# Patient Record
Sex: Female | Born: 1985
Health system: Southern US, Community
[De-identification: ages and names within clinical notes are randomized; demographics above are authoritative.]

## PROBLEM LIST (undated history)

## (undated) DIAGNOSIS — O280 Abnormal hematological finding on antenatal screening of mother: Secondary | ICD-10-CM

## (undated) DIAGNOSIS — O09899 Supervision of other high risk pregnancies, unspecified trimester: Secondary | ICD-10-CM

## (undated) HISTORY — PX: NO PAST SURGERIES: SHX2092

## (undated) HISTORY — DX: Supervision of other high risk pregnancies, unspecified trimester: O09.899

## (undated) HISTORY — DX: Abnormal hematological finding on antenatal screening of mother: O28.0

---

## 2013-10-20 ENCOUNTER — Ambulatory Visit (INDEPENDENT_AMBULATORY_CARE_PROVIDER_SITE_OTHER): Payer: BC Managed Care – PPO | Admitting: Emergency Medicine

## 2013-10-20 VITALS — BP 102/64 | HR 81 | Temp 98.5°F | Resp 18 | Ht 61.0 in | Wt 107.0 lb

## 2013-10-20 DIAGNOSIS — M545 Low back pain, unspecified: Secondary | ICD-10-CM

## 2013-10-20 DIAGNOSIS — R112 Nausea with vomiting, unspecified: Secondary | ICD-10-CM

## 2013-10-20 DIAGNOSIS — N912 Amenorrhea, unspecified: Secondary | ICD-10-CM

## 2013-10-20 LAB — POCT CBC
GRANULOCYTE PERCENT: 64.3 % (ref 37–80)
HCT, POC: 41.4 % (ref 37.7–47.9)
Hemoglobin: 13.3 g/dL (ref 12.2–16.2)
LYMPH, POC: 1.7 (ref 0.6–3.4)
MCH, POC: 26 pg — AB (ref 27–31.2)
MCHC: 32 g/dL (ref 31.8–35.4)
MCV: 81.3 fL (ref 80–97)
MID (CBC): 0.3 (ref 0–0.9)
MPV: 7.7 fL (ref 0–99.8)
PLATELET COUNT, POC: 278 10*3/uL (ref 142–424)
POC GRANULOCYTE: 3.7 (ref 2–6.9)
POC LYMPH %: 30.3 % (ref 10–50)
POC MID %: 5.4 % (ref 0–12)
RBC: 5.09 M/uL (ref 4.04–5.48)
RDW, POC: 14.7 %
WBC: 5.7 10*3/uL (ref 4.6–10.2)

## 2013-10-20 LAB — POCT URINALYSIS DIPSTICK
BILIRUBIN UA: NEGATIVE
Blood, UA: NEGATIVE
GLUCOSE UA: NEGATIVE
Ketones, UA: NEGATIVE
LEUKOCYTES UA: NEGATIVE
NITRITE UA: NEGATIVE
Protein, UA: NEGATIVE
Spec Grav, UA: 1.025
Urobilinogen, UA: 0.2
pH, UA: 6.5

## 2013-10-20 LAB — POCT UA - MICROSCOPIC ONLY
BACTERIA, U MICROSCOPIC: NEGATIVE
Casts, Ur, LPF, POC: NEGATIVE
Crystals, Ur, HPF, POC: NEGATIVE
RBC, URINE, MICROSCOPIC: NEGATIVE
WBC, UR, HPF, POC: NEGATIVE
Yeast, UA: NEGATIVE

## 2013-10-20 LAB — POCT URINE PREGNANCY: PREG TEST UR: NEGATIVE

## 2013-10-20 MED ORDER — RANITIDINE HCL 150 MG PO TABS: 150.0000 mg | ORAL_TABLET | Freq: Two times a day (BID) | ORAL | Status: DC

## 2013-10-20 NOTE — Patient Instructions (Signed)
Esophagitis Esophagitis is inflammation of the esophagus. It can involve swelling, soreness, and pain in the esophagus. This condition can make it difficult and painful to swallow. CAUSES  Most causes of esophagitis are not serious. Many different factors can cause esophagitis, including:  Gastroesophageal reflux disease (GERD). This is when acid from your stomach flows up into the esophagus.  Recurrent vomiting.  An allergic-type reaction.  Certain medicines, especially those that come in large pills.  Ingestion of harmful chemicals, such as household cleaning products.  Heavy alcohol use.  An infection of the esophagus.  Radiation treatment for cancer.  Certain diseases such as sarcoidosis, Crohn's disease, and scleroderma. These diseases may cause recurrent esophagitis. SYMPTOMS   Trouble swallowing.  Painful swallowing.  Chest pain.  Difficulty breathing.  Nausea.  Vomiting.  Abdominal pain. DIAGNOSIS  Your caregiver will take your history and do a physical exam. Depending upon what your caregiver finds, certain tests may also be done, including:  Barium X-ray. You will drink a solution that coats the esophagus, and X-rays will be taken.  Endoscopy. A lighted tube is put down the esophagus so your caregiver can examine the area.  Allergy tests. These can sometimes be arranged through follow-up visits. TREATMENT  Treatment will depend on the cause of your esophagitis. In some cases, steroids or other medicines may be given to help relieve your symptoms or to treat the underlying cause of your condition. Medicines that may be recommended include:  Viscous lidocaine, to soothe the esophagus.  Antacids.  Acid reducers.  Proton pump inhibitors.  Antiviral medicines for certain viral infections of the esophagus.  Antifungal medicines for certain fungal infections of the esophagus.  Antibiotic medicines, depending on the cause of the esophagitis. HOME CARE  INSTRUCTIONS   Avoid foods and drinks that seem to make your symptoms worse.  Eat small, frequent meals instead of large meals.  Avoid eating for the 3 hours prior to your bedtime.  If you have trouble taking pills, use a pill splitter to decrease the size and likelihood of the pill getting stuck or injuring the esophagus on the way down. Drinking water after taking a pill also helps.  Stop smoking if you smoke.  Maintain a healthy weight.  Wear loose-fitting clothing. Do not wear anything tight around your waist that causes pressure on your stomach.  Raise the head of your bed 6 to 8 inches with wood blocks to help you sleep. Extra pillows will not help.  Only take over-the-counter or prescription medicines as directed by your caregiver. SEEK IMMEDIATE MEDICAL CARE IF:  You have severe chest pain that radiates into your arm, neck, or jaw.  You feel sweaty, dizzy, or lightheaded.  You have shortness of breath.  You vomit blood.  You have difficulty or pain with swallowing.  You have bloody or black, tarry stools.  You have a fever.  You have a burning sensation in the chest more than 3 times a week for more than 2 weeks.  You cannot swallow, drink, or eat.  You drool because you cannot swallow your saliva. MAKE SURE YOU:  Understand these instructions.  Will watch your condition.  Will get help right away if you are not doing well or get worse. Document Released: 02/29/2004 Document Revised: 04/15/2011 Document Reviewed: 09/21/2010 [employer] Patient Information 2015 [employer]. This information is not intended to replace advice given to you by your health care provider. Make sure you discuss any questions you have with your health care provider.  

## 2013-10-20 NOTE — Progress Notes (Signed)
Subjective:    Patient ID: Mia Pierce, female    DOB: 12-08-1985, 28 y.o.   MRN: [id number]  HPI 28 year old female here for back pain, heartburn, and emesis. Pt feels she may be pregnant. Pt's husband is translating. She has felt this way last week with upset stomach burning sensation but because of the issue of possibly being pregnant she would not take any medication.   Review of Systems     Objective:   Physical Exam H. EENT exam is unremarkable. Neck supple without adenopathy. Chest clear.patient and percussion. Heart regular rate without murmurs. Abdomen soft no hepatosplenomegaly    Results for orders placed in visit on 10/20/13  POCT URINE PREGNANCY      Result Value Ref Range   Preg Test, Ur Negative    POCT URINALYSIS DIPSTICK      Result Value Ref Range   Color, UA yellow     Clarity, UA clear     Glucose, UA neg     Bilirubin, UA neg     Ketones, UA neg     Spec Grav, UA 1.025     Blood, UA neg     pH, UA 6.5     Protein, UA neg     Urobilinogen, UA 0.2     Nitrite, UA neg     Leukocytes, UA Negative    POCT UA - MICROSCOPIC ONLY      Result Value Ref Range   WBC, Ur, HPF, POC neg     RBC, urine, microscopic neg     Bacteria, U Microscopic neg     Mucus, UA small     Epithelial cells, urine per micros 2-6     Crystals, Ur, HPF, POC neg     Casts, Ur, LPF, POC neg     Yeast, UA neg     Results for orders placed in visit on 10/20/13  POCT URINE PREGNANCY      Result Value Ref Range   Preg Test, Ur Negative    POCT URINALYSIS DIPSTICK      Result Value Ref Range   Color, UA yellow     Clarity, UA clear     Glucose, UA neg     Bilirubin, UA neg     Ketones, UA neg     Spec Grav, UA 1.025     Blood, UA neg     pH, UA 6.5     Protein, UA neg     Urobilinogen, UA 0.2     Nitrite, UA neg     Leukocytes, UA Negative    POCT UA - MICROSCOPIC ONLY      Result Value Ref Range   WBC, Ur, HPF, POC neg     RBC, urine, microscopic neg     Bacteria, U  Microscopic neg     Mucus, UA small     Epithelial cells, urine per micros 2-6     Crystals, Ur, HPF, POC neg     Casts, Ur, LPF, POC neg     Yeast, UA neg    POCT CBC      Result Value Ref Range   WBC 5.7  4.6 - 10.2 K/uL   Lymph, poc 1.7  0.6 - 3.4   POC LYMPH PERCENT 30.3  10 - 50 %L   MID (cbc) 0.3  0 - 0.9   POC MID % 5.4  0 - 12 %M   POC Granulocyte 3.7  2 -  6.9   Granulocyte percent 64.3  37 - 80 %G   RBC 5.09  4.04 - 5.48 M/uL   Hemoglobin 13.3  12.2 - 16.2 g/dL   HCT, POC 40.9  81.1 - 47.9 %   MCV 81.3  80 - 97 fL   MCH, POC 26.0 (*) 27 - 31.2 pg   MCHC 32.0  31.8 - 35.4 g/dL   RDW, POC 91.4     Platelet Count, POC 278  142 - 424 K/uL   MPV 7.7  0 - 99.8 fL       Assessment & Plan:  Will treat with zantac for heartburn. Will check serum pregnancy test. Blood work done for liver function test and h. Pylori.

## 2013-10-21 LAB — COMPLETE METABOLIC PANEL WITH GFR
ALK PHOS: 35 U/L — AB (ref 39–117)
ALT: 16 U/L (ref 0–35)
AST: 16 U/L (ref 0–37)
Albumin: 4.6 g/dL (ref 3.5–5.2)
BILIRUBIN TOTAL: 0.6 mg/dL (ref 0.2–1.2)
BUN: 9 mg/dL (ref 6–23)
CO2: 23 meq/L (ref 19–32)
Calcium: 9.2 mg/dL (ref 8.4–10.5)
Chloride: 103 mEq/L (ref 96–112)
Creat: 0.62 mg/dL (ref 0.50–1.10)
Glucose, Bld: 77 mg/dL (ref 70–99)
Potassium: 4.7 mEq/L (ref 3.5–5.3)
SODIUM: 137 meq/L (ref 135–145)
TOTAL PROTEIN: 7.6 g/dL (ref 6.0–8.3)

## 2013-10-21 LAB — HCG, QUANTITATIVE, PREGNANCY

## 2013-10-21 LAB — LIPASE: LIPASE: 10 U/L (ref 0–75)

## 2013-10-22 LAB — HELICOBACTER PYLORI  ANTIBODY, IGM: Helicobacter pylori, IgM: 1.9 U/mL (ref <9.0)

## 2014-01-29 ENCOUNTER — Ambulatory Visit (INDEPENDENT_AMBULATORY_CARE_PROVIDER_SITE_OTHER): Payer: BC Managed Care – PPO | Admitting: Physician Assistant

## 2014-01-29 VITALS — BP 97/63 | HR 80 | Temp 98.3°F | Resp 16 | Ht 61.0 in | Wt 108.8 lb

## 2014-01-29 DIAGNOSIS — H5713 Ocular pain, bilateral: Secondary | ICD-10-CM

## 2014-01-29 LAB — POCT SEDIMENTATION RATE: POCT SED RATE: 11 mm/hr (ref 0–22)

## 2014-01-29 MED ORDER — POLYETHYL GLYCOL-PROPYL GLYCOL 0.4-0.3 % OP GEL: OPHTHALMIC | Status: DC

## 2014-01-29 NOTE — Progress Notes (Signed)
IDENTIFYING INFORMATION  Mia Pierce / DOB: Jul 09, 1985 / MRN: [id number]  The patient  does not have a problem list on file.  SUBJECTIVE  CC: Eye Pain   HPI: [name] is a 28 y.o. y.o. female presenting for two weeks of bilateral eye pain that she describes as itching, burning, and pain.  She reports that her eyes often itch and burn while at work.  She admits her eyes are better when she is home.  She works in a [employer] and has been doing this work for 4 months now, but did not have eye problems previous to the last two weeks.  She denies changes in vision at this time. She denies any chemical splashes at the onset of the eye pain. She denies discharge.  She denies HA, dizziness, sore throat, ear pain, or nasal congestion.  She has not tried anything for this problem.  She does not wear contacts.   She  has no past medical history on file.    She has a current medication list which includes the following prescription(s): ranitidine.  [name] has No Known Allergies. She  reports that she has never smoked. She does not have any smokeless tobacco history on file. She reports that she does not drink alcohol or use illicit drugs. She  has no sexual activity history on file.  The patient  has no past surgical history on file.  Her family history is not on file.  ROS  Per HPI  OBJECTIVE  Blood pressure 97/63, pulse 80, temperature 98.3 F (36.8 C), temperature source Oral, resp. rate 16, height 5\' 1"  (1.549 m), weight 108 lb 12.8 oz (49.351 kg), last menstrual period 01/22/2014, SpO2 99 %. The patient's body mass index is 20.57 kg/(m^2).  Physical Exam  HENT:  Right Ear: Hearing, tympanic membrane, external ear and ear canal normal.  Left Ear: Hearing, tympanic membrane, external ear and ear canal normal.  Nose: Nose normal.  Mouth/Throat: Uvula is midline, oropharynx is clear and moist and mucous membranes are normal.  Eyes: Lids are normal. Right eye exhibits no chemosis, no  discharge, no exudate and no hordeolum. No foreign body present in the right eye. Left eye exhibits no chemosis, no discharge, no exudate and no hordeolum. No foreign body present in the left eye. Right conjunctiva is not injected. Right conjunctiva has no hemorrhage. Left conjunctiva is not injected. Left conjunctiva has no hemorrhage. No scleral icterus. Right eye exhibits normal extraocular motion and no nystagmus. Left eye exhibits normal extraocular motion and no nystagmus. Right pupil is round and reactive. Left pupil is round and reactive. Pupils are equal.     Visual Acuity Screening   Right eye Left eye Both eyes  Without correction:     With correction: 20/40 20/30 20/30       No results found for this or any previous visit (from the past 24 hour(s)).  ASSESSMENT & PLAN  Mia DuralLis was seen today for eye pain.  Diagnoses and associated orders for this visit:  Eye pain, bilateral: Could not gather enough information in clinic to rule out uveitis and there is no clear cause of eye pain. Nothing in HPI suggest corneal abrasion or HSV.  Sending to Optho for further evaluation.  - POCT SEDIMENTATION RATE: Will follow if opthalmologic exam is unyielding.  - Ambulatory referral to Ophthalmology - Polyethyl Glycol-Propyl Glycol (SYSTANE) 0.4-0.3 % GEL; Use one to two drops in the eyes as needed.    The patient  was instructed to to call or comeback to clinic as needed, or should symptoms warrant.  Deliah BostonMichael Bufford Helms, MHS, PA-C [facility] 01/29/2014 1:10 PM

## 2014-01-29 NOTE — Patient Instructions (Signed)
You have been referred to an ophthalmologist.  Please go to that visit which should take place next week.  If you wear contacts please stop until you see the eye doctor.

## 2014-04-25 ENCOUNTER — Encounter (HOSPITAL_COMMUNITY): Payer: Self-pay | Admitting: Emergency Medicine

## 2014-04-25 ENCOUNTER — Emergency Department (HOSPITAL_COMMUNITY)
Admission: EM | Admit: 2014-04-25 | Discharge: 2014-04-25 | Disposition: A | Discharge status: Home / Self Care | Payer: BLUE CROSS/BLUE SHIELD | Source: Home / Self Care | Attending: Emergency Medicine | Admitting: Emergency Medicine

## 2014-04-25 DIAGNOSIS — J069 Acute upper respiratory infection, unspecified: Secondary | ICD-10-CM

## 2014-04-25 LAB — POCT RAPID STREP A: STREPTOCOCCUS, GROUP A SCREEN (DIRECT): NEGATIVE

## 2014-04-25 MED ORDER — ACETAMINOPHEN 325 MG PO TABS
ORAL_TABLET | ORAL | Status: AC
  Filled 2014-04-25: qty 2

## 2014-04-25 MED ORDER — IPRATROPIUM BROMIDE 0.06 % NA SOLN: 2.0000 | Freq: Four times a day (QID) | NASAL | Status: DC

## 2014-04-25 MED ORDER — ACETAMINOPHEN 325 MG PO TABS
650.0000 mg | ORAL_TABLET | Freq: Once | ORAL | Status: AC
  Administered 2014-04-25: 650 mg via ORAL

## 2014-04-25 NOTE — ED Notes (Signed)
C/o ST onset 1 week Sx include: fevers, HA, BA, abd pain Pain increases when swallowing Alert, no signs of acute distress.

## 2014-04-25 NOTE — Discharge Instructions (Signed)
Rapid strep negative. Specimen will be held for 3 day culture and if results indicate need for additional treatment you will be notified by phone. Atrovent nasal spray for congestion and post nasal drainage, tylenol or ibuprofen as directed on packaging for pain Expect improvement over next few days. Please establish care with local primary care doctor.  Upper Respiratory Infection, Adult An upper respiratory infection (URI) is also sometimes known as the common cold. The upper respiratory tract includes the nose, sinuses, throat, trachea, and bronchi. Bronchi are the airways leading to the lungs. Most people improve within 1 week, but symptoms can last up to 2 weeks. A residual cough may last even longer.  CAUSES Many different viruses can infect the tissues lining the upper respiratory tract. The tissues become irritated and inflamed and often become very moist. Mucus production is also common. A cold is contagious. You can easily spread the virus to others by oral contact. This includes kissing, sharing a glass, coughing, or sneezing. Touching your mouth or nose and then touching a surface, which is then touched by another person, can also spread the virus. SYMPTOMS  Symptoms typically develop 1 to 3 days after you come in contact with a cold virus. Symptoms vary from person to person. They may include:  Runny nose.  Sneezing.  Nasal congestion.  Sinus irritation.  Sore throat.  Loss of voice (laryngitis).  Cough.  Fatigue.  Muscle aches.  Loss of appetite.  Headache.  Low-grade fever. DIAGNOSIS  You might diagnose your own cold based on familiar symptoms, since most people get a cold 2 to 3 times a year. Your caregiver can confirm this based on your exam. Most importantly, your caregiver can check that your symptoms are not due to another disease such as strep throat, sinusitis, pneumonia, asthma, or epiglottitis. Blood tests, throat tests, and X-rays are not necessary to  diagnose a common cold, but they may sometimes be helpful in excluding other more serious diseases. Your caregiver will decide if any further tests are required. RISKS AND COMPLICATIONS  You may be at risk for a more severe case of the common cold if you smoke cigarettes, have chronic heart disease (such as heart failure) or lung disease (such as asthma), or if you have a weakened immune system. The very young and very old are also at risk for more serious infections. Bacterial sinusitis, middle ear infections, and bacterial pneumonia can complicate the common cold. The common cold can worsen asthma and chronic obstructive pulmonary disease (COPD). Sometimes, these complications can require emergency medical care and may be life-threatening. PREVENTION  The best way to protect against getting a cold is to practice good hygiene. Avoid oral or hand contact with people with cold symptoms. Wash your hands often if contact occurs. There is no clear evidence that vitamin C, vitamin E, echinacea, or exercise reduces the chance of developing a cold. However, it is always recommended to get plenty of rest and practice good nutrition. TREATMENT  Treatment is directed at relieving symptoms. There is no cure. Antibiotics are not effective, because the infection is caused by a virus, not by bacteria. Treatment may include:  Increased fluid intake. Sports drinks offer valuable electrolytes, sugars, and fluids.  Breathing heated mist or steam (vaporizer or shower).  Eating chicken soup or other clear broths, and maintaining good nutrition.  Getting plenty of rest.  Using gargles or lozenges for comfort.  Controlling fevers with ibuprofen or acetaminophen as directed by your caregiver.  Increasing usage  of your inhaler if you have asthma. Zinc gel and zinc lozenges, taken in the first 24 hours of the common cold, can shorten the duration and lessen the severity of symptoms. Pain medicines may help with fever,  muscle aches, and throat pain. A variety of non-prescription medicines are available to treat congestion and runny nose. Your caregiver can make recommendations and may suggest nasal or lung inhalers for other symptoms.  HOME CARE INSTRUCTIONS   Only take over-the-counter or prescription medicines for pain, discomfort, or fever as directed by your caregiver.  Use a warm mist humidifier or inhale steam from a shower to increase air moisture. This may keep secretions moist and make it easier to breathe.  Drink enough water and fluids to keep your urine clear or pale yellow.  Rest as needed.  Return to work when your temperature has returned to normal or as your caregiver advises. You may need to stay home longer to avoid infecting others. You can also use a face mask and careful hand washing to prevent spread of the virus. SEEK MEDICAL CARE IF:   After the first few days, you feel you are getting worse rather than better.  You need your caregiver's advice about medicines to control symptoms.  You develop chills, worsening shortness of breath, or brown or red sputum. These may be signs of pneumonia.  You develop yellow or brown nasal discharge or pain in the face, especially when you bend forward. These may be signs of sinusitis.  You develop a fever, swollen neck glands, pain with swallowing, or white areas in the back of your throat. These may be signs of strep throat. SEEK IMMEDIATE MEDICAL CARE IF:   You have a fever.  You develop severe or persistent headache, ear pain, sinus pain, or chest pain.  You develop wheezing, a prolonged cough, cough up blood, or have a change in your usual mucus (if you have chronic lung disease).  You develop sore muscles or a stiff neck. Document Released: 07/17/2000 Document Revised: 04/15/2011 Document Reviewed: 04/28/2013 [facility] Patient Information 2015 [place], [place]. This information is not intended to replace advice given to you by your health  care provider. Make sure you discuss any questions you have with your health care provider.

## 2014-04-25 NOTE — ED Provider Notes (Signed)
CSN: [id number]     Arrival date & time 04/25/14  [id number] History   First MD Initiated Contact with Patient 04/25/14 [phone]     Chief Complaint  Patient presents with  . Sore Throat   (Consider location/radiation/quality/duration/timing/severity/associated sxs/prior Treatment) HPI Comments: Reports herself to be otherwise healthy Nonsmoker Works as Advertising account plannernail technician PCP: none  Patient is a 29 y.o. female presenting with URI. The history is provided by the patient and the spouse. The history is limited by a language barrier. A language interpreter was used.  URI Presenting symptoms: congestion, cough, fever, rhinorrhea and sore throat   Presenting symptoms comment:  +post nasal drainage Severity:  Mild Onset quality:  Gradual Duration:  7 days Timing:  Constant Progression:  Unchanged Chronicity:  New Ineffective treatments:  None tried Associated symptoms: myalgias   Associated symptoms: no wheezing     History reviewed. No pertinent past medical history. History reviewed. No pertinent past surgical history. No family history on file. History  Substance Use Topics  . Smoking status: Never Smoker   . Smokeless tobacco: Not on file  . Alcohol Use: No   OB History    No data available     Review of Systems  Constitutional: Positive for fever.  HENT: Positive for congestion, rhinorrhea and sore throat. Negative for mouth sores and trouble swallowing.   Eyes: Negative.   Respiratory: Positive for cough. Negative for shortness of breath and wheezing.   Cardiovascular: Negative.   Gastrointestinal: Negative.   Musculoskeletal: Positive for myalgias.    Allergies  Review of patient's allergies indicates no known allergies.  Home Medications   Prior to Admission medications   Medication Sig Start Date End Date Taking? Authorizing Provider  ipratropium (ATROVENT) 0.06 % nasal spray Place 2 sprays into both nostrils 4 (four) times daily. For nasal congestion 04/25/14   Ria ClockJennifer  Lee H [place] Latulippe, PA  Polyethyl Glycol-Propyl Glycol (SYSTANE) 0.4-0.3 % GEL Use one to two drops in the eyes as needed. 01/29/14   Ofilia NeasMichael L Clark, PA-C  ranitidine (ZANTAC) 150 MG tablet Take 1 tablet (150 mg total) by mouth 2 (two) times daily. Patient not taking: Reported on 01/29/2014 10/20/13   Collene GobbleSteven A Daub, MD   BP 119/91 mmHg  Pulse 83  Temp(Src) 98.3 F (36.8 C) (Oral)  Resp 18  SpO2 99% Physical Exam  Constitutional: She is oriented to person, place, and time. She appears well-developed and well-nourished. No distress.  HENT:  Head: Normocephalic and atraumatic.  Right Ear: Hearing, tympanic membrane, external ear and ear canal normal.  Left Ear: Hearing, tympanic membrane, external ear and ear canal normal.  Nose: Nose normal.  Mouth/Throat: Mucous membranes are normal. No oral lesions. No trismus in the jaw. No uvula swelling. Posterior oropharyngeal erythema present.  +cobblestoning of posterior oropharynx  Neck: Normal range of motion. Neck supple.  Cardiovascular: Normal rate, regular rhythm and normal heart sounds.   Pulmonary/Chest: Effort normal and breath sounds normal.  Musculoskeletal: Normal range of motion.  Lymphadenopathy:    She has no cervical adenopathy.  Neurological: She is alert and oriented to person, place, and time.  Skin: Skin is warm and dry.  Psychiatric: She has a normal mood and affect. Her behavior is normal.  Nursing note and vitals reviewed.   ED Course  Procedures (including critical care time) Labs Review Labs Reviewed - No data to display  Imaging Review No results found.   MDM   1. URI (upper respiratory infection)  Rapid strep negative. Specimen will be held for 3 day culture and if results indicate need for additional treatment she will be notified by phone. Atrovent nasal spray, tylenol, fluids Follow up prn  Ria Clock, PA 04/25/14 [id number]

## 2014-04-26 ENCOUNTER — Encounter (HOSPITAL_COMMUNITY): Payer: Self-pay | Admitting: Emergency Medicine

## 2014-04-26 ENCOUNTER — Emergency Department (HOSPITAL_COMMUNITY)
Admission: EM | Admit: 2014-04-26 | Discharge: 2014-04-26 | Disposition: A | Discharge status: Home / Self Care | Payer: BLUE CROSS/BLUE SHIELD | Attending: Emergency Medicine | Admitting: Emergency Medicine

## 2014-04-26 DIAGNOSIS — J029 Acute pharyngitis, unspecified: Secondary | ICD-10-CM

## 2014-04-26 DIAGNOSIS — Z79899 Other long term (current) drug therapy: Secondary | ICD-10-CM | POA: Insufficient documentation

## 2014-04-26 MED ORDER — CETIRIZINE-PSEUDOEPHEDRINE ER 5-120 MG PO TB12: 1.0000 | ORAL_TABLET | Freq: Two times a day (BID) | ORAL | Status: DC

## 2014-04-26 NOTE — Discharge Instructions (Signed)
Take zyrtec as directed. Return to the Emergency Department with worsening or concerning symptoms.

## 2014-04-26 NOTE — ED Notes (Signed)
Patient states went to urgent care yesterday and her throat is still hurting today.  Patient states pain x 1 week.  Patient states urgent care only gave her nasal spray, but no other prescription.

## 2014-04-26 NOTE — ED Provider Notes (Signed)
CSN: [id number]     Arrival date & time 04/26/14  0919 History  This chart was scribed for non-physician practitioner Emilia BeckKaitlyn Felise Georgia, working with Azalia BilisKevin Campos, MD by Carl Bestelina Holson, ED Scribe. This patient was seen in room TR05C/TR05C and the patient's care was started at 10:40 AM.   Chief Complaint  Patient presents with  . Sore Throat    Patient is a 29 y.o. female presenting with pharyngitis. The history is provided by the spouse. No language interpreter was used.  Sore Throat   HPI Comments: Mia RidgelLis Pierce is a 29 y.o. female who presents to the Emergency Department complaining of constant sore throat that started a week ago.  Her symptoms have worsened over the past two days.  She has taken Tylenol, Theraflu, and throat spray with no relief to her symptoms.  She lists subjective fever and nasal congestion as associated symptoms.  History reviewed. No pertinent past medical history. History reviewed. No pertinent past surgical history. No family history on file. History  Substance Use Topics  . Smoking status: Never Smoker   . Smokeless tobacco: Not on file  . Alcohol Use: No   OB History    No data available     Review of Systems  Constitutional: Positive for fever (subjective).  HENT: Positive for congestion and sore throat.   All other systems reviewed and are negative.   Allergies  Review of patient's allergies indicates no known allergies.  Home Medications   Prior to Admission medications   Medication Sig Start Date End Date Taking? Authorizing Provider  ipratropium (ATROVENT) 0.06 % nasal spray Place 2 sprays into both nostrils 4 (four) times daily. For nasal congestion 04/25/14   Ria ClockJennifer Lee H Presson, PA  Polyethyl Glycol-Propyl Glycol (SYSTANE) 0.4-0.3 % GEL Use one to two drops in the eyes as needed. 01/29/14   Ofilia NeasMichael L Clark, PA-C  ranitidine (ZANTAC) 150 MG tablet Take 1 tablet (150 mg total) by mouth 2 (two) times daily. Patient not taking: Reported on  01/29/2014 10/20/13   Collene GobbleSteven A Daub, MD   BP 108/66 mmHg  Pulse 98  Temp(Src) 98.1 F (36.7 C) (Oral)  Resp 22  Wt 110 lb (49.896 kg)  SpO2 100% Physical Exam  Constitutional: She is oriented to person, place, and time. She appears well-developed and well-nourished. No distress.  HENT:  Head: Normocephalic and atraumatic.  Mouth/Throat: Oropharynx is clear and moist. No oropharyngeal exudate.  Cobblestoning of posterior pharynx  Eyes: Conjunctivae and EOM are normal.  Neck: Normal range of motion.  Cardiovascular: Normal rate and regular rhythm.  Exam reveals no gallop and no friction rub.   No murmur heard. Pulmonary/Chest: Effort normal and breath sounds normal. She has no wheezes. She has no rales. She exhibits no tenderness.  Abdominal: Soft. She exhibits no distension. There is no tenderness. There is no rebound.  Musculoskeletal: Normal range of motion.  Neurological: She is alert and oriented to person, place, and time.  Speech is goal-oriented. Moves limbs without ataxia.   Skin: Skin is warm and dry.  Psychiatric: She has a normal mood and affect. Her behavior is normal.  Nursing note and vitals reviewed.   ED Course  Procedures (including critical care time)  DIAGNOSTIC STUDIES: Oxygen Saturation is 100% on room air, normal by my interpretation.    COORDINATION OF CARE: 10:42 AM- Will discharge the patient with a decongestant and the patient agreed to the treatment plan.  Labs Review Labs Reviewed - No data to display  Imaging Review No results found.   EKG Interpretation None      MDM   Final diagnoses:  Sore throat    Patients symptoms likely due to viral illness and nasal congestion. Vitals stable and patient afebrile. Patient's rapid strep culture pending from yesterday's UC visit.   I personally performed the services described in this documentation, which was scribed in my presence. The recorded information has been reviewed and is  accurate.   Emilia Beck, PA-C 04/27/14 2103  Azalia Bilis, MD 04/28/14 [phone]

## 2014-04-26 NOTE — ED Notes (Signed)
Pt  Husband  Called   Stating   wifes  Throat  No  Better      chartt  reviewd  After  Getting pts  Permission  Dr  Mervyn Skeeterskindl  Consulted    Advised  Wait  For  Final  Test  Result   otc  meds   For  Symptoms  And  followup if  worse

## 2014-04-28 LAB — CULTURE, GROUP A STREP: STREP A CULTURE: NEGATIVE

## 2014-11-26 ENCOUNTER — Ambulatory Visit (INDEPENDENT_AMBULATORY_CARE_PROVIDER_SITE_OTHER): Payer: BLUE CROSS/BLUE SHIELD | Admitting: Internal Medicine

## 2014-11-26 VITALS — BP 110/60 | HR 79 | Temp 98.5°F | Resp 16 | Ht 61.0 in | Wt 111.6 lb

## 2014-11-26 DIAGNOSIS — Z3200 Encounter for pregnancy test, result unknown: Secondary | ICD-10-CM | POA: Diagnosis not present

## 2014-11-26 DIAGNOSIS — M5489 Other dorsalgia: Secondary | ICD-10-CM

## 2014-11-26 LAB — POCT URINE PREGNANCY: Preg Test, Ur: POSITIVE — AB

## 2014-11-26 NOTE — Progress Notes (Signed)
   Subjective:    Patient ID: Mia Pierce, female    DOB: August 26, 1985, 29 y.o.   MRN: [id number] This chart was scribed for Ellamae Siaobert Belvia Gotschall, MD by Jolene Provostobert Halas, Medical Scribe. This patient was seen in Room 10 and the patient's care was started a 8:16 AM.  Chief Complaint  Patient presents with  . Possible Pregnancy    Needs pregnancy test  . Immunizations    Flu Vaccine  . Back Pain    x 3 days    HPI HPI Comments: Mia RidgelLis Heffler is a 29 y.o. female who [facility] pregnancy test. She states she used a [facility] pregnancy test which showed positive, and she is here to have that test validated. LMP 5 weeks ago. Here w/ husband. One other child.  She is also complaining of pain in the right posterior thoracic area over the past 3-5 days without a clear history of injury. This hurts when she reaches her twin's. There is no problem with inspiration. There are no radicular symptoms. She has no skin rashes. There is no cough or cold.   Review of Systems  Constitutional: Positive for fatigue. Negative for fever and chills.  Gastrointestinal: Negative for nausea and abdominal pain.  Genitourinary: Negative for dysuria, vaginal bleeding, vaginal pain, menstrual problem and pelvic pain.  Musculoskeletal: Negative for myalgias and back pain.       Objective:   Physical Exam  Constitutional: She is oriented to person, place, and time. She appears well-developed and well-nourished. No distress.  HENT:  Head: Normocephalic and atraumatic.  Eyes: Pupils are equal, round, and reactive to light.  Neck: Neck supple.  Cardiovascular: Normal rate.   Pulmonary/Chest: Effort normal. No respiratory distress.  Musculoskeletal: Normal range of motion.  Tender to palpation in the posterior axillary line over the mid thoracic area on the right with no defect or ecchymoses. Good range of motion of the shoulder and arm. Full excursion of the chest. No rales.  Neurological: She is alert and oriented  to person, place, and time. Coordination normal.  Skin: Skin is warm and dry. She is not diaphoretic.  Psychiatric: She has a normal mood and affect. Her behavior is normal.  Nursing note and vitals reviewed. BP 110/60 mmHg  Pulse 79  Temp(Src) 98.5 F (36.9 C) (Oral)  Resp 16  Ht 5\' 1"  (1.549 m)  Wt 111 lb 9.6 oz (50.621 kg)  BMI 21.10 kg/m2  SpO2 96%  LMP 10/25/2014 Results for orders placed or performed in visit on 11/26/14  POCT urine pregnancy  Result Value Ref Range   Preg Test, Ur Positive (A) Negative     Filed Vitals:   11/26/14 0810  BP: 110/60  Pulse: 79  Temp: 98.5 F (36.9 C)  TempSrc: Oral  Resp: 16  Height: 5\' 1"  (1.549 m)  Weight: 111 lb 9.6 oz (50.621 kg)  SpO2: 96%       Assessment & Plan:  Possible pregnancy, not yet confirmed - Plan:f/u w/ her OB  Other back pain - Plan: POCT urine pregnancy, Urine culture = mild thoracic strain///stretch ans heat  I have completed the patient encounter in its entirety as documented by the scribe, with editing by me where necessary. Treyton Slimp P. Merla Richesoolittle, M.D.  By signing my name below, I, Javier Dockerobert Ryan Halas, attest that this documentation has been prepared under the direction and in the presence of Ellamae Siaobert Korynne Dols, MD. Electronically Signed: Javier Dockerobert Ryan Halas, ER Scribe. 11/26/2014. 8:18 AM.

## 2014-11-27 LAB — URINE CULTURE: Colony Count: 35000

## 2014-12-06 ENCOUNTER — Ambulatory Visit: Payer: BLUE CROSS/BLUE SHIELD | Admitting: Family Medicine

## 2014-12-06 ENCOUNTER — Other Ambulatory Visit: Payer: Self-pay | Admitting: Family Medicine

## 2014-12-06 DIAGNOSIS — Z3201 Encounter for pregnancy test, result positive: Secondary | ICD-10-CM

## 2014-12-06 NOTE — Progress Notes (Signed)
Patient was seen 11/26/14 and had a positive urine pregnancy test at that time. She is requesting referral to obstetrics.

## 2015-01-02 LAB — OB RESULTS CONSOLE ABO/RH: RH TYPE: POSITIVE

## 2015-01-02 LAB — OB RESULTS CONSOLE GC/CHLAMYDIA
Chlamydia: NEGATIVE
Gonorrhea: NEGATIVE

## 2015-01-02 LAB — OB RESULTS CONSOLE ANTIBODY SCREEN: Antibody Screen: NEGATIVE

## 2015-01-02 LAB — OB RESULTS CONSOLE RUBELLA ANTIBODY, IGM: RUBELLA: IMMUNE

## 2015-01-02 LAB — OB RESULTS CONSOLE HIV ANTIBODY (ROUTINE TESTING): HIV: NONREACTIVE

## 2015-01-02 LAB — OB RESULTS CONSOLE HEPATITIS B SURFACE ANTIGEN: Hepatitis B Surface Ag: NEGATIVE

## 2015-01-02 LAB — OB RESULTS CONSOLE RPR: RPR: NONREACTIVE

## 2015-01-31 ENCOUNTER — Other Ambulatory Visit (HOSPITAL_COMMUNITY): Payer: Self-pay | Admitting: Obstetrics and Gynecology

## 2015-02-05 NOTE — L&D Delivery Note (Signed)
Delivery Note Pt progressed rapidly.  At 7:44 AM a viable and healthy female was delivered via Vaginal, Spontaneous Delivery (Presentation: ; Occiput Anterior).  APGAR: 7, 9; weight  P.   Placenta status: Intact, Spontaneous.  Cord: 3 vessels with the following complications: Nuchal x 2.  NICU present for delivery.    Anesthesia: None  Episiotomy: None Lacerations: 2nd degree;Perineal Suture Repair: 3.0 vicryl rapide Est. Blood Loss (mL): 200  Mom to postpartum.  Baby to Couplet care / Skin to Skin.  Bovard-Stuckert, Montay Vanvoorhis 07/13/2015, 8:12 AM  A+/Bo/Tdap in PNC/RI/ Contra? Declines circ

## 2015-02-08 ENCOUNTER — Other Ambulatory Visit (HOSPITAL_COMMUNITY): Payer: Self-pay | Admitting: Obstetrics and Gynecology

## 2015-02-08 DIAGNOSIS — O289 Unspecified abnormal findings on antenatal screening of mother: Secondary | ICD-10-CM

## 2015-02-14 ENCOUNTER — Encounter (HOSPITAL_COMMUNITY): Payer: Self-pay

## 2015-02-14 ENCOUNTER — Ambulatory Visit (HOSPITAL_COMMUNITY)
Admission: RE | Admit: 2015-02-14 | Discharge: 2015-02-14 | Disposition: A | Discharge status: Home / Self Care | Payer: BLUE CROSS/BLUE SHIELD | Source: Ambulatory Visit | Attending: Obstetrics and Gynecology | Admitting: Obstetrics and Gynecology

## 2015-02-14 ENCOUNTER — Other Ambulatory Visit (HOSPITAL_COMMUNITY): Payer: Self-pay | Admitting: Obstetrics and Gynecology

## 2015-02-14 DIAGNOSIS — O280 Abnormal hematological finding on antenatal screening of mother: Secondary | ICD-10-CM

## 2015-02-14 DIAGNOSIS — O281 Abnormal biochemical finding on antenatal screening of mother: Secondary | ICD-10-CM | POA: Diagnosis not present

## 2015-02-14 DIAGNOSIS — O289 Unspecified abnormal findings on antenatal screening of mother: Secondary | ICD-10-CM

## 2015-02-14 DIAGNOSIS — Z315 Encounter for genetic counseling: Secondary | ICD-10-CM | POA: Diagnosis not present

## 2015-02-14 DIAGNOSIS — Z3689 Encounter for other specified antenatal screening: Secondary | ICD-10-CM

## 2015-02-14 DIAGNOSIS — O09899 Supervision of other high risk pregnancies, unspecified trimester: Secondary | ICD-10-CM

## 2015-02-14 DIAGNOSIS — Z3A16 16 weeks gestation of pregnancy: Secondary | ICD-10-CM | POA: Diagnosis not present

## 2015-02-14 HISTORY — DX: Abnormal hematological finding on antenatal screening of mother: O28.0

## 2015-02-14 HISTORY — DX: Supervision of other high risk pregnancies, unspecified trimester: O09.899

## 2015-02-14 NOTE — Progress Notes (Signed)
Genetic Counseling  High-Risk Gestation Note  Appointment Date:  02/14/2015 Referred By: Edwinna Areola, * Date of Birth:  18-Jul-1985 Partner:  Warden Fillers   Pregnancy History: [id number] Estimated Date of Delivery: 08/01/15 Estimated Gestational Age: [redacted]w[redacted]d Attending: Particia Nearing, MD   [name] and her husband, Mr. Jalayla Chrismer, were seen for genetic counseling because of an increased risk for fetal Down syndrome based on first trimester screening through [facility]. Telephonic Vietnamese/English interpreter via [employer] provided interpretation for today's visit.   In Summary:  1 in 6 Down syndrome risk from First trimester screen  Detailed ultrasound performed today and within normal limits; Complete report sent under separate cover  [name] elected to proceed with NIPS (Panorama) today; declined amniocentesis  Patient requested her husband, [name], be contacted with NIPS results  Low PAPP-A on first screen (0.28 MoM); discussed increased risk for adverse pregnancy outcomes and recommendation for third trimester growth ultrasound  They were counseled regarding the First trimester screen result and the associated 1 in 6 risk for fetal Down syndrome.  We reviewed chromosomes, nondisjunction, and the common features and variable prognosis of Down syndrome.  In addition, we reviewed the screen negative risk for trisomy 91.  We also discussed other explanations for a screen positive result including: differences in maternal metabolism, and normal variation. They understand that this screening is not diagnostic for Down syndrome but provides a risk assessment.  We specifically discussed that the level of one of the proteins analyzed on the screen, PAPP-A, was very low (0.28 MoM).  This has been associated with an increased risk for growth restriction or poor pregnancy outcome later in pregnancy; therefore, we would recommend a follow up ultrasound for fetal growth in the third  trimester.  We reviewed available screening options including noninvasive prenatal screening (NIPS)/cell free fetal DNA (cffDNA) testing, and detailed ultrasound.  They were counseled that screening tests are used to modify a patient's a priori risk for aneuploidy, typically based on age. This estimate provides a pregnancy specific risk assessment. We reviewed the benefits and limitations of each option. Specifically, we discussed the conditions for which each test screens, the detection rates, and false positive rates of each. They were also counseled regarding diagnostic testing via amniocentesis. We reviewed the approximate 1 in 300-500 risk for complications for amniocentesis, including spontaneous pregnancy loss.   Detailed ultrasound was performed today and was within normal limits. Complete ultrasound results reported separately.   After consideration of all the options, they elected to proceed with NIPS (Panorama).  Those results will be available in 8-10 days. The patient requested we call her husband, [name], who speaks [place], with these results once available. Diagnostic testing was declined.  They understand that screening tests cannot rule out all birth defects or genetic syndromes. The patient was advised of this limitation and states she still does not want additional testing at this time.   Both family histories were reviewed and found to be contributory for possible medical condition for Mr. Eckley female paternal first cousin. He was unable to describe this relative's condition but stated that sometimes he is "different" and that this comes from the relative's mother's health conditions (who is not related to Mr. Partin). We discussed that without more information it is difficult to assess what this particular condition is, the etiology, and the recurrence risk for relatives. Given the degree of relation alone, recurrence risk is likely low. However, without further information regarding the  provided family history, an  accurate genetic risk cannot be calculated. Further genetic counseling is warranted if more information is obtained.  [name] denied exposure to environmental toxins or chemical agents. She denied the use of alcohol, tobacco or street drugs. She denied significant viral illnesses during the course of her pregnancy. Her medical and surgical histories were noncontributory.   I counseled this couple for approximately 45 minutes regarding the above risks and available options.   Quinn PlowmanKaren Tison Leibold, MS,  Certified Genetic Counselor 02/14/2015

## 2015-02-23 ENCOUNTER — Telehealth (HOSPITAL_COMMUNITY): Payer: Self-pay | Admitting: MS"

## 2015-02-23 NOTE — Telephone Encounter (Signed)
Called [name]'s husband, Mia Pierce, per patient's previous request to discuss her prenatal cell free DNA test results.  [name] had Panorama testing through [facility].  Testing was offered because of Down syndrome risk from first screen.   The patient was identified by name and DOB.  We reviewed that these are within normal limits, showing a less than 1 in 10,000 risk for trisomies 21, 18 and 13, and monosomy X (Turner syndrome).  In addition, the risk for triploidy/vanishing twin and sex chromosome trisomies (47,XXX and 47,XXY) was also low risk.  We reviewed that this testing identifies > 99% of pregnancies with trisomy 31, trisomy 25, sex chromosome trisomies (47,XXX and 47,XXY), and triploidy. The detection rate for trisomy 18 is 96%.  The detection rate for monosomy X is ~92%.  The false positive rate is <0.1% for all conditions. Testing was also consistent with female fetal sex. They understand that this testing does not identify all genetic conditions.  All questions were answered to his satisfaction, he was encouraged to call with additional questions or concerns.  Quinn Plowman, MS Certified Genetic Counselor 02/23/2015 2:27 PM

## 2015-02-24 ENCOUNTER — Encounter (HOSPITAL_COMMUNITY): Payer: Self-pay

## 2015-02-24 ENCOUNTER — Other Ambulatory Visit (HOSPITAL_COMMUNITY): Payer: Self-pay

## 2015-02-28 ENCOUNTER — Other Ambulatory Visit (HOSPITAL_COMMUNITY): Payer: Self-pay | Admitting: MS"

## 2015-03-02 ENCOUNTER — Encounter (HOSPITAL_COMMUNITY): Payer: BLUE CROSS/BLUE SHIELD

## 2015-03-02 ENCOUNTER — Inpatient Hospital Stay (HOSPITAL_COMMUNITY): Admission: RE | Admit: 2015-03-02 | Payer: BLUE CROSS/BLUE SHIELD | Source: Ambulatory Visit

## 2015-03-15 ENCOUNTER — Inpatient Hospital Stay (HOSPITAL_COMMUNITY)
Admission: AD | Admit: 2015-03-15 | Payer: BLUE CROSS/BLUE SHIELD | Source: Ambulatory Visit | Admitting: Obstetrics and Gynecology

## 2015-05-08 DIAGNOSIS — Z36 Encounter for antenatal screening of mother: Secondary | ICD-10-CM | POA: Diagnosis not present

## 2015-05-08 DIAGNOSIS — O289 Unspecified abnormal findings on antenatal screening of mother: Secondary | ICD-10-CM | POA: Diagnosis not present

## 2015-05-08 DIAGNOSIS — Z0372 Encounter for suspected placental problem ruled out: Secondary | ICD-10-CM | POA: Diagnosis not present

## 2015-05-08 DIAGNOSIS — Z3A27 27 weeks gestation of pregnancy: Secondary | ICD-10-CM | POA: Diagnosis not present

## 2015-05-17 DIAGNOSIS — Z3A29 29 weeks gestation of pregnancy: Secondary | ICD-10-CM | POA: Diagnosis not present

## 2015-05-17 DIAGNOSIS — Z23 Encounter for immunization: Secondary | ICD-10-CM | POA: Diagnosis not present

## 2015-05-17 DIAGNOSIS — Z3483 Encounter for supervision of other normal pregnancy, third trimester: Secondary | ICD-10-CM | POA: Diagnosis not present

## 2015-05-31 DIAGNOSIS — Z3A31 31 weeks gestation of pregnancy: Secondary | ICD-10-CM | POA: Diagnosis not present

## 2015-05-31 DIAGNOSIS — Z3483 Encounter for supervision of other normal pregnancy, third trimester: Secondary | ICD-10-CM | POA: Diagnosis not present

## 2015-06-15 DIAGNOSIS — O3663X0 Maternal care for excessive fetal growth, third trimester, not applicable or unspecified: Secondary | ICD-10-CM | POA: Diagnosis not present

## 2015-06-15 DIAGNOSIS — Z3A33 33 weeks gestation of pregnancy: Secondary | ICD-10-CM | POA: Diagnosis not present

## 2015-06-29 DIAGNOSIS — O289 Unspecified abnormal findings on antenatal screening of mother: Secondary | ICD-10-CM | POA: Diagnosis not present

## 2015-06-29 DIAGNOSIS — Z36 Encounter for antenatal screening of mother: Secondary | ICD-10-CM | POA: Diagnosis not present

## 2015-06-29 DIAGNOSIS — O3663X0 Maternal care for excessive fetal growth, third trimester, not applicable or unspecified: Secondary | ICD-10-CM | POA: Diagnosis not present

## 2015-06-29 DIAGNOSIS — Z3A35 35 weeks gestation of pregnancy: Secondary | ICD-10-CM | POA: Diagnosis not present

## 2015-06-29 LAB — OB RESULTS CONSOLE GBS: STREP GROUP B AG: NEGATIVE

## 2015-07-13 ENCOUNTER — Inpatient Hospital Stay (HOSPITAL_COMMUNITY)
Admission: AD | Admit: 2015-07-13 | Discharge: 2015-07-15 | DRG: 775 | Disposition: A | Discharge status: Home / Self Care | Payer: BLUE CROSS/BLUE SHIELD | Source: Ambulatory Visit | Attending: Obstetrics and Gynecology | Admitting: Obstetrics and Gynecology

## 2015-07-13 ENCOUNTER — Encounter (HOSPITAL_COMMUNITY): Payer: Self-pay

## 2015-07-13 DIAGNOSIS — Z23 Encounter for immunization: Secondary | ICD-10-CM | POA: Diagnosis not present

## 2015-07-13 DIAGNOSIS — Z3A37 37 weeks gestation of pregnancy: Secondary | ICD-10-CM | POA: Diagnosis not present

## 2015-07-13 DIAGNOSIS — O09899 Supervision of other high risk pregnancies, unspecified trimester: Secondary | ICD-10-CM

## 2015-07-13 DIAGNOSIS — IMO0001 Reserved for inherently not codable concepts without codable children: Secondary | ICD-10-CM

## 2015-07-13 DIAGNOSIS — O280 Abnormal hematological finding on antenatal screening of mother: Secondary | ICD-10-CM

## 2015-07-13 LAB — CBC
HCT: 31.5 % — ABNORMAL LOW (ref 36.0–46.0)
HEMOGLOBIN: 10.4 g/dL — AB (ref 12.0–15.0)
MCH: 24.2 pg — AB (ref 26.0–34.0)
MCHC: 33 g/dL (ref 30.0–36.0)
MCV: 73.4 fL — AB (ref 78.0–100.0)
PLATELETS: 271 10*3/uL (ref 150–400)
RBC: 4.29 MIL/uL (ref 3.87–5.11)
RDW: 14.8 % (ref 11.5–15.5)
WBC: 7.9 10*3/uL (ref 4.0–10.5)

## 2015-07-13 LAB — TYPE AND SCREEN
ABO/RH(D): A POS
Antibody Screen: NEGATIVE

## 2015-07-13 LAB — ABO/RH: ABO/RH(D): A POS

## 2015-07-13 LAB — RPR: RPR: NONREACTIVE

## 2015-07-13 MED ORDER — WITCH HAZEL-GLYCERIN EX PADS: 1.0000 "application " | MEDICATED_PAD | CUTANEOUS | Status: DC | PRN

## 2015-07-13 MED ORDER — OXYCODONE-ACETAMINOPHEN 5-325 MG PO TABS: 2.0000 | ORAL_TABLET | ORAL | Status: DC | PRN

## 2015-07-13 MED ORDER — SIMETHICONE 80 MG PO CHEW: 80.0000 mg | CHEWABLE_TABLET | ORAL | Status: DC | PRN

## 2015-07-13 MED ORDER — PHENYLEPHRINE 40 MCG/ML (10ML) SYRINGE FOR IV PUSH (FOR BLOOD PRESSURE SUPPORT)
80.0000 ug | PREFILLED_SYRINGE | INTRAVENOUS | Status: DC | PRN
  Filled 2015-07-13: qty 5
  Filled 2015-07-13: qty 10

## 2015-07-13 MED ORDER — LACTATED RINGERS IV SOLN
INTRAVENOUS | Status: DC
Start: 2015-07-13 — End: 2015-07-13
  Administered 2015-07-13: 05:00:00 via INTRAVENOUS

## 2015-07-13 MED ORDER — FENTANYL 2.5 MCG/ML BUPIVACAINE 1/10 % EPIDURAL INFUSION (WH - ANES)
14.0000 mL/h | INTRAMUSCULAR | Status: DC | PRN
  Filled 2015-07-13: qty 125

## 2015-07-13 MED ORDER — SENNOSIDES-DOCUSATE SODIUM 8.6-50 MG PO TABS
2.0000 | ORAL_TABLET | ORAL | Status: DC
  Administered 2015-07-13 – 2015-07-14 (×2): 2 via ORAL
  Filled 2015-07-13 (×2): qty 2

## 2015-07-13 MED ORDER — ONDANSETRON HCL 4 MG/2ML IJ SOLN: 4.0000 mg | Freq: Four times a day (QID) | INTRAMUSCULAR | Status: DC | PRN

## 2015-07-13 MED ORDER — DIBUCAINE 1 % RE OINT: 1.0000 "application " | TOPICAL_OINTMENT | RECTAL | Status: DC | PRN

## 2015-07-13 MED ORDER — ZOLPIDEM TARTRATE 5 MG PO TABS: 5.0000 mg | ORAL_TABLET | Freq: Every evening | ORAL | Status: DC | PRN

## 2015-07-13 MED ORDER — SOD CITRATE-CITRIC ACID 500-334 MG/5ML PO SOLN: 30.0000 mL | ORAL | Status: DC | PRN

## 2015-07-13 MED ORDER — PRENATAL MULTIVITAMIN CH
1.0000 | ORAL_TABLET | Freq: Every day | ORAL | Status: DC
  Administered 2015-07-13 – 2015-07-14 (×2): 1 via ORAL
  Filled 2015-07-13 (×2): qty 1

## 2015-07-13 MED ORDER — OXYCODONE-ACETAMINOPHEN 5-325 MG PO TABS: 1.0000 | ORAL_TABLET | ORAL | Status: DC | PRN

## 2015-07-13 MED ORDER — LACTATED RINGERS IV SOLN
500.0000 mL | Freq: Once | INTRAVENOUS | Status: AC
  Administered 2015-07-13: 500 mL via INTRAVENOUS

## 2015-07-13 MED ORDER — ACETAMINOPHEN 325 MG PO TABS: 650.0000 mg | ORAL_TABLET | ORAL | Status: DC | PRN

## 2015-07-13 MED ORDER — BENZOCAINE-MENTHOL 20-0.5 % EX AERO
1.0000 "application " | INHALATION_SPRAY | CUTANEOUS | Status: DC | PRN
  Administered 2015-07-13: 1 via TOPICAL
  Filled 2015-07-13: qty 56

## 2015-07-13 MED ORDER — OXYCODONE-ACETAMINOPHEN 5-325 MG PO TABS
1.0000 | ORAL_TABLET | ORAL | Status: DC | PRN
  Administered 2015-07-13 – 2015-07-15 (×5): 1 via ORAL
  Filled 2015-07-13 (×5): qty 1

## 2015-07-13 MED ORDER — BUTORPHANOL TARTRATE 1 MG/ML IJ SOLN
1.0000 mg | INTRAMUSCULAR | Status: DC | PRN
  Administered 2015-07-13: 1 mg via INTRAVENOUS
  Filled 2015-07-13: qty 1

## 2015-07-13 MED ORDER — COCONUT OIL OIL
1.0000 | TOPICAL_OIL | Status: DC | PRN
Start: 2015-07-13 — End: 2015-07-15

## 2015-07-13 MED ORDER — TETANUS-DIPHTH-ACELL PERTUSSIS 5-2.5-18.5 LF-MCG/0.5 IM SUSP: 0.5000 mL | Freq: Once | INTRAMUSCULAR | Status: DC

## 2015-07-13 MED ORDER — LACTATED RINGERS IV SOLN: INTRAVENOUS | Status: DC

## 2015-07-13 MED ORDER — ONDANSETRON HCL 4 MG PO TABS: 4.0000 mg | ORAL_TABLET | ORAL | Status: DC | PRN

## 2015-07-13 MED ORDER — OXYTOCIN BOLUS FROM INFUSION
500.0000 mL | INTRAVENOUS | Status: DC
  Administered 2015-07-13: 500 mL via INTRAVENOUS

## 2015-07-13 MED ORDER — EPHEDRINE 5 MG/ML INJ
10.0000 mg | INTRAVENOUS | Status: DC | PRN
  Filled 2015-07-13: qty 2

## 2015-07-13 MED ORDER — DIPHENHYDRAMINE HCL 50 MG/ML IJ SOLN: 12.5000 mg | INTRAMUSCULAR | Status: DC | PRN

## 2015-07-13 MED ORDER — IBUPROFEN 600 MG PO TABS
600.0000 mg | ORAL_TABLET | Freq: Four times a day (QID) | ORAL | Status: DC
  Administered 2015-07-13 – 2015-07-15 (×9): 600 mg via ORAL
  Filled 2015-07-13 (×9): qty 1

## 2015-07-13 MED ORDER — DIPHENHYDRAMINE HCL 25 MG PO CAPS: 25.0000 mg | ORAL_CAPSULE | Freq: Four times a day (QID) | ORAL | Status: DC | PRN

## 2015-07-13 MED ORDER — ONDANSETRON HCL 4 MG/2ML IJ SOLN: 4.0000 mg | INTRAMUSCULAR | Status: DC | PRN

## 2015-07-13 MED ORDER — LIDOCAINE HCL (PF) 1 % IJ SOLN
30.0000 mL | INTRAMUSCULAR | Status: AC | PRN
  Administered 2015-07-13: 30 mL via SUBCUTANEOUS
  Filled 2015-07-13: qty 30

## 2015-07-13 MED ORDER — LACTATED RINGERS IV SOLN
500.0000 mL | INTRAVENOUS | Status: DC | PRN
  Administered 2015-07-13: 1000 mL via INTRAVENOUS

## 2015-07-13 MED ORDER — OXYTOCIN 40 UNITS IN LACTATED RINGERS INFUSION - SIMPLE MED
2.5000 [IU]/h | INTRAVENOUS | Status: DC
  Administered 2015-07-13: 39.96 [IU]/h via INTRAVENOUS
  Filled 2015-07-13: qty 1000

## 2015-07-13 NOTE — H&P (Signed)
Mia Pierce Schools is a 30 y.o. female, G2 P1001, EGA 37+ weeks [facility] for leaking fluid and ctx.  Eval in MAU confirms ROM, painful ctx, VE 3 cm dilated per RN.  Prenatal care complicated by 1:6 risk Trisomy 21 on 1st trimester screen, low risk female by Panorama with normal u/s.   . Maternal Medical History:  Reason for admission: Rupture of membranes and contractions.   Fetal activity: Perceived fetal activity is normal.      OB History    Gravida Para Term Preterm AB TAB SAB Ectopic Multiple Living   2 1 1       1     SVD, no complications  No past medical history on file. No past surgical history on file. Family History: family history is not on file. Social History:  reports that she has never smoked. She does not have any smokeless tobacco history on file. She reports that she does not drink alcohol or use illicit drugs.   Prenatal Transfer Tool  Maternal Diabetes: No Genetic Screening: Abnormal:  Results: Elevated risk of Trisomy 21 Maternal Ultrasounds/Referrals: Normal Fetal Ultrasounds or other Referrals:  None Maternal Substance Abuse:  No Significant Maternal Medications:  None Significant Maternal Lab Results:  Lab values include: Group B Strep negative Other Comments:  1:6 risk Trisomy 21 by 1st trimester screen, low risk female by Panorama  Review of Systems  Respiratory: Negative.   Cardiovascular: Negative.     Dilation: 3 Effacement (%): 60 Station: -2 Exam by:: ansah-mensah, rnc  Blood pressure 140/84, pulse 104, temperature 98 F (36.7 C), temperature source Oral, resp. rate 15, height 5' 0.5" (1.537 m), weight 68.493 kg (151 lb), last menstrual period 10/25/2014, SpO2 100 %. Maternal Exam:  Uterine Assessment: Contraction strength is moderate.  Contraction frequency is irregular.   Abdomen: Patient reports no abdominal tenderness. Estimated fetal weight is 7 lbs.   Fetal presentation: vertex  Introitus: Normal vulva. Normal vagina.  Amniotic  fluid character: clear.  Pelvis: adequate for delivery.   Cervix: Cervix evaluated by digital exam.     Physical Exam  Constitutional: She appears well-developed and well-nourished.  GI: Soft.    Prenatal labs: ABO, Rh: A/Positive/-- (11/28 0000) Antibody: Negative (11/28 0000) Rubella: Immune (11/28 0000) RPR: Nonreactive (11/28 0000)  HBsAg: Negative (11/28 0000)  HIV: Non-reactive (11/28 0000)  GBS: Negative (05/25 0000)   Assessment/Plan: IUP at 37+ weeks with SROM in early labor.  Will admit and monitor progress, augment if needed.     Omolara Carol D 07/13/2015, 4:26 AM

## 2015-07-13 NOTE — MAU Note (Signed)
Pt reports ROM at 0245, some contractions.

## 2015-07-14 LAB — CBC
HCT: 27 % — ABNORMAL LOW (ref 36.0–46.0)
HEMOGLOBIN: 8.9 g/dL — AB (ref 12.0–15.0)
MCH: 24.3 pg — AB (ref 26.0–34.0)
MCHC: 33 g/dL (ref 30.0–36.0)
MCV: 73.8 fL — ABNORMAL LOW (ref 78.0–100.0)
PLATELETS: 241 10*3/uL (ref 150–400)
RBC: 3.66 MIL/uL — AB (ref 3.87–5.11)
RDW: 15.1 % (ref 11.5–15.5)
WBC: 12.7 10*3/uL — AB (ref 4.0–10.5)

## 2015-07-14 NOTE — Progress Notes (Signed)
Post Partum Day 1 Subjective: no complaints and tolerating PO Pt speaks limited english  Objective: Blood pressure 123/80, pulse 85, temperature 98.2 F (36.8 C), temperature source Oral, resp. rate 18, height 5' 0.5" (1.537 m), weight 151 lb (68.493 kg), last menstrual period 10/25/2014, SpO2 99 %, unknown if currently breastfeeding.  Physical Exam:  General: alert and cooperative Lochia: appropriate Uterine Fundus: firm    Recent Labs  07/13/15 0438 07/14/15 0529  HGB 10.4* 8.9*  HCT 31.5* 27.0*    Assessment/Plan: Plan for discharge tomorrow given baby 37 weeks    LOS: 1 day   Maysin Carstens W 07/14/2015, 9:03 AM

## 2015-07-14 NOTE — Discharge Summary (Signed)
OB Discharge Summary     Patient Name: Mia Pierce DOB: Feb 15, 1985 MRN: [id number]  Date of admission: 07/13/2015 Delivering MD: Sherian Rein   Date of discharge: 07/15/2015  Admitting diagnosis: 37 WEEKS ROM Intrauterine pregnancy: [redacted]w[redacted]d     Secondary diagnosis:  Principal Problem:   SVD (spontaneous vaginal delivery) Active Problems:   Active labor at term  Additional problems: none     Discharge diagnosis: Term Pregnancy Delivered                                                                                                Post partum procedures:none  Augmentation: none  Complications: None  Hospital course:  Onset of Labor With Vaginal Delivery     30 y.o. yo G2P2001 at [redacted]w[redacted]d was admitted in Active Labor on 07/13/2015. Patient had an uncomplicated labor course as follows:  Membrane Rupture Time/Date: 2:45 AM ,07/13/2015   Intrapartum Procedures: Episiotomy: None [1]                                         Lacerations:  2nd degree [3];Perineal [11]  Patient had a delivery of a Viable infant. 07/13/2015  Information for the patient's newborn:  Kellee, Sittner [[id number]]        Pateint had an uncomplicated postpartum course.  She is ambulating, tolerating a regular diet, passing flatus, and urinating well. Patient is discharged home in stable condition on 07/15/2015.    Physical exam  Filed Vitals:   07/13/15 2300 07/14/15 0530 07/14/15 1735 07/15/15 0550  BP: 118/81 123/80 112/58 100/53  Pulse: 91 85 85 84  Temp: 98.2 F (36.8 C) 98.2 F (36.8 C) 98.4 F (36.9 C) 97.7 F (36.5 C)  TempSrc: Oral Oral Oral Oral  Resp: Height:      Weight:      SpO2:       General: alert and cooperative Lochia: appropriate Uterine Fundus: firm  Labs: Lab Results  Component Value Date   WBC 12.7* 07/14/2015   HGB 8.9* 07/14/2015   HCT 27.0* 07/14/2015   MCV 73.8* 07/14/2015   PLT 241 07/14/2015   CMP Latest Ref Rng 10/20/2013  Glucose 70 - 99 mg/dL 77   BUN 6 - 23 mg/dL 9  Creatinine 6.21 - 3.08 mg/dL 6.57  Sodium 846 - 962 mEq/L 137  Potassium 3.5 - 5.3 mEq/L 4.7  Chloride 96 - 112 mEq/L 103  CO2 19 - 32 mEq/L 23  Calcium 8.4 - 10.5 mg/dL 9.2  Total Protein 6.0 - 8.3 g/dL 7.6  Total Bilirubin 0.2 - 1.2 mg/dL 0.6  Alkaline Phos 39 - 117 U/L 35(L)  AST 0 - 37 U/L 16  ALT 0 - 35 U/L 16    Discharge instruction: per After Visit Summary and "Baby and Me Booklet".  After visit meds:    Medication List    STOP taking these medications        DICLEGIS PO  TAKE these medications        ibuprofen 600 MG tablet  Commonly known as:  ADVIL,MOTRIN  Take 1 tablet (600 mg total) by mouth every 6 (six) hours.     oxyCODONE-acetaminophen 5-325 MG tablet  Commonly known as:  PERCOCET/ROXICET  Take 1 tablet by mouth every 4 (four) hours as needed (pain scale 4-7).     PRENATAL VITAMIN PO  Take by mouth.        Diet: routine diet  Activity: Advance as tolerated. Pelvic rest for 6 weeks.   Outpatient follow up:6 weeks Follow up Appt:No future appointments. Follow up Visit:No Follow-up on file.  Postpartum contraception: Undecided  Newborn Data: Live born female  Birth Weight: 6 lb 12.1 oz (3065 g) APGAR: 7, 9  Baby Feeding: Breast Disposition:home with mother   07/15/2015 Oliver PilaICHARDSON,Roxane Puerto W, MD

## 2015-07-15 MED ORDER — IBUPROFEN 600 MG PO TABS: 600.0000 mg | ORAL_TABLET | Freq: Four times a day (QID) | ORAL | Status: DC

## 2015-07-15 MED ORDER — OXYCODONE-ACETAMINOPHEN 5-325 MG PO TABS: 1.0000 | ORAL_TABLET | ORAL | Status: DC | PRN

## 2015-07-15 NOTE — Progress Notes (Signed)
Post Partum Day 2 Subjective: no complaints and tolerating PO  Objective: Blood pressure 100/53, pulse 84, temperature 97.7 F (36.5 C), temperature source Oral, resp. rate 18, height 5' 0.5" (1.537 m), weight 151 lb (68.493 kg), last menstrual period 10/25/2014, SpO2 99 %, unknown if currently breastfeeding.  Physical Exam:  General: alert and cooperative Lochia: appropriate Uterine Fundus: firm     Recent Labs  07/13/15 0438 07/14/15 0529  HGB 10.4* 8.9*  HCT 31.5* 27.0*    Assessment/Plan: Discharge home  Husband translating and they decline circumcision   LOS: 2 days   Charnika Herbst W 07/15/2015, 10:10 AM

## 2015-08-17 DIAGNOSIS — Z3009 Encounter for other general counseling and advice on contraception: Secondary | ICD-10-CM | POA: Diagnosis not present

## 2015-08-17 DIAGNOSIS — Z1389 Encounter for screening for other disorder: Secondary | ICD-10-CM | POA: Diagnosis not present

## 2015-11-22 DIAGNOSIS — R05 Cough: Secondary | ICD-10-CM | POA: Diagnosis not present

## 2015-11-22 DIAGNOSIS — Z5321 Procedure and treatment not carried out due to patient leaving prior to being seen by health care provider: Secondary | ICD-10-CM | POA: Diagnosis not present

## 2015-11-22 DIAGNOSIS — R509 Fever, unspecified: Secondary | ICD-10-CM | POA: Diagnosis not present

## 2015-11-22 DIAGNOSIS — Z79899 Other long term (current) drug therapy: Secondary | ICD-10-CM | POA: Diagnosis not present

## 2016-02-10 ENCOUNTER — Ambulatory Visit (INDEPENDENT_AMBULATORY_CARE_PROVIDER_SITE_OTHER): Payer: BLUE CROSS/BLUE SHIELD | Admitting: Physician Assistant

## 2016-02-10 VITALS — BP 110/78 | HR 80 | Temp 98.3°F | Resp 16 | Ht 60.5 in | Wt 121.8 lb

## 2016-02-10 DIAGNOSIS — H9203 Otalgia, bilateral: Secondary | ICD-10-CM

## 2016-02-10 DIAGNOSIS — R05 Cough: Secondary | ICD-10-CM | POA: Diagnosis not present

## 2016-02-10 DIAGNOSIS — R059 Cough, unspecified: Secondary | ICD-10-CM

## 2016-02-10 MED ORDER — AZELASTINE HCL 0.15 % NA SOLN: 2.0000 | Freq: Two times a day (BID) | NASAL | 0 refills | Status: DC

## 2016-02-10 MED ORDER — GUAIFENESIN ER 1200 MG PO TB12: 1.0000 | ORAL_TABLET | Freq: Two times a day (BID) | ORAL | 1 refills | Status: DC | PRN

## 2016-02-10 MED ORDER — BENZONATATE 100 MG PO CAPS: 100.0000 mg | ORAL_CAPSULE | Freq: Three times a day (TID) | ORAL | 0 refills | Status: DC | PRN

## 2016-02-10 NOTE — Patient Instructions (Addendum)
Get plenty of rest and drink at least 64 ounces of water daily. The ear pain may be caused by eustachian tube dysfunction, and if so, the nasal spray may help. If it persists, please let us know.    IF you received an x-ray today, you will receive an invoice from [facility]. Please contact [facility] at [phone] with questions or concerns regarding your invoice.   IF you received labwork today, you will receive an invoice from [facility]. Please contact [facility] at [phone] with questions or concerns regarding your invoice.   Our billing staff will not be able to assist you with questions regarding bills from these companies.  You will be contacted with the lab results as soon as they are available. The fastest way to get your results is to activate your My Chart account. Instructions are located on the last page of this paperwork. If you have not heard from us regarding the results in 2 weeks, please contact this office.

## 2016-02-10 NOTE — Progress Notes (Signed)
Patient ID: Mia Pierce, female    DOB: May 17, 1985, 31 y.o.   MRN: [id number]  PCP: No PCP Per Patient  Chief Complaint  Patient presents with  . Cough    x 2 weeks, productive  . Flu Vaccine    Pended    Subjective:   Presents for evaluation of cough x 1 week. She is accompanied by her husband, who translates, and their 2 children.  Coughing causes pain in the throat a little bit. Productive of yellow sputum.  No nasal/sinus congestion now, but had some a few days ago. Ear pain bilateral, x a couple of years. Has seen ENT and reports she was told that nothing is wrong. Sometimes SOB. Now it's "just a little bit." OTC cough medication last night without benefit. No nausea, vomiting, diarrhea. No urinary symptoms. No back or chest pain.   Review of Systems As above    There are no active problems to display for this patient.    Prior to Admission medications   Medication Sig Start Date End Date Taking? Authorizing Provider  ibuprofen (ADVIL,MOTRIN) 600 MG tablet Take 1 tablet (600 mg total) by mouth every 6 (six) hours. Patient not taking: Reported on 02/10/2016 07/15/15   Huel Cote, MD  oxyCODONE-acetaminophen (PERCOCET/ROXICET) 5-325 MG tablet Take 1 tablet by mouth every 4 (four) hours as needed (pain scale 4-7). Patient not taking: Reported on 02/10/2016 07/15/15   Huel Cote, MD     No Known Allergies     Objective:  Physical Exam  Constitutional: She is oriented to person, place, and time. She appears well-developed and well-nourished. She is active and cooperative. No distress.  BP 110/78   Pulse 80   Temp 98.3 F (36.8 C) (Oral)   Resp 16   Ht 5' 0.5" (1.537 m)   Wt 121 lb 12.8 oz (55.2 kg)   LMP 02/03/2016   SpO2 98%   Breastfeeding? No   BMI 23.40 kg/m   HENT:  Head: Normocephalic and atraumatic.  Right Ear: Hearing, tympanic membrane, external ear and ear canal normal.  Left Ear: Hearing, tympanic membrane, external ear and ear  canal normal.  Nose: Mucosal edema present. No rhinorrhea. No epistaxis. Right sinus exhibits no maxillary sinus tenderness and no frontal sinus tenderness. Left sinus exhibits no maxillary sinus tenderness and no frontal sinus tenderness.  Mouth/Throat: Uvula is midline, oropharynx is clear and moist and mucous membranes are normal.  Eyes: Conjunctivae are normal. No scleral icterus.  Neck: Normal range of motion. Neck supple. No thyromegaly present.  Cardiovascular: Normal rate, regular rhythm and normal heart sounds.   Pulses:      Radial pulses are 2+ on the right side, and 2+ on the left side.  Pulmonary/Chest: Effort normal and breath sounds normal.  Lymphadenopathy:       Head (right side): No tonsillar, no preauricular, no posterior auricular and no occipital adenopathy present.       Head (left side): No tonsillar, no preauricular, no posterior auricular and no occipital adenopathy present.    She has no cervical adenopathy.       Right: No supraclavicular adenopathy present.       Left: No supraclavicular adenopathy present.  Neurological: She is alert and oriented to person, place, and time. No sensory deficit.  Skin: Skin is warm, dry and intact. No rash noted. No cyanosis or erythema. Nails show no clubbing.  Psychiatric: She has a normal mood and affect. Her speech is normal and  behavior is normal.           Assessment & Plan:   1. Cough Likely post-viral cough syndrome. 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 Supportive care. - Azelastine HCl 0.15 % SOLN; Place 2 sprays into both nostrils 2 (two) times daily.  Dispense: 30 mL; Refill: 0 - benzonatate (TESSALON) 100 MG capsule; Take 1-2 capsules (100-200 mg total) by mouth 3 (three) times daily as needed for cough.  Dispense: 40 capsule; Refill: 0 - Guaifenesin (MUCINEX MAXIMUM STRENGTH) 1200 MG TB12; Take 1 tablet (1,200 mg total) by mouth every 12 (twelve) hours as  needed.  Dispense: 14 tablet; Refill: 1  2. Ear pain, bilateral Possibly ETD caused by allergy/irritation to fumes in nail salon. - Azelastine HCl 0.15 % SOLN; Place 2 sprays into both nostrils 2 (two) times daily.  Dispense: 30 mL; Refill: 0   Fernande Brashelle S. Myrissa Chipley, PA-C Physician Assistant-Certified Primary Care at [facility]

## 2016-03-14 DIAGNOSIS — Z01419 Encounter for gynecological examination (general) (routine) without abnormal findings: Secondary | ICD-10-CM | POA: Diagnosis not present

## 2016-03-14 DIAGNOSIS — Z1389 Encounter for screening for other disorder: Secondary | ICD-10-CM | POA: Diagnosis not present

## 2016-03-14 DIAGNOSIS — Z113 Encounter for screening for infections with a predominantly sexual mode of transmission: Secondary | ICD-10-CM | POA: Diagnosis not present

## 2016-03-14 DIAGNOSIS — Z6822 Body mass index (BMI) 22.0-22.9, adult: Secondary | ICD-10-CM | POA: Diagnosis not present

## 2016-03-14 DIAGNOSIS — Z130 Encounter for screening for diseases of the blood and blood-forming organs and certain disorders involving the immune mechanism: Secondary | ICD-10-CM | POA: Diagnosis not present

## 2017-01-15 ENCOUNTER — Other Ambulatory Visit: Payer: Self-pay

## 2017-01-15 ENCOUNTER — Ambulatory Visit: Payer: BLUE CROSS/BLUE SHIELD | Admitting: Physician Assistant

## 2017-01-15 ENCOUNTER — Encounter: Payer: Self-pay | Admitting: Physician Assistant

## 2017-01-15 VITALS — BP 104/56 | HR 94 | Temp 98.7°F | Resp 16 | Ht 61.0 in | Wt 119.6 lb

## 2017-01-15 DIAGNOSIS — H9202 Otalgia, left ear: Secondary | ICD-10-CM | POA: Diagnosis not present

## 2017-01-15 DIAGNOSIS — H60502 Unspecified acute noninfective otitis externa, left ear: Secondary | ICD-10-CM | POA: Diagnosis not present

## 2017-01-15 MED ORDER — AMOXICILLIN 875 MG PO TABS: 875.0000 mg | ORAL_TABLET | Freq: Two times a day (BID) | ORAL | 0 refills | Status: AC

## 2017-01-15 MED ORDER — OFLOXACIN 0.3 % OT SOLN: 5.0000 [drp] | Freq: Every day | OTIC | 0 refills | Status: AC

## 2017-01-15 NOTE — Progress Notes (Signed)
   Mia Pierce  MRN: [id number] DOB: 04-22-1985  PCP: Patient, No Pcp Per  Subjective:  Pt is a 31 year old female who presents to clinic for left ear pain x 4 days. She is here today with her husband who is translating. She endorses pain and drainage. She has not taken anything to feel better.  She has h/o b/l ear pain. Has been evaluated by ENT in the past and told her ears are "normal".   Review of Systems  Constitutional: Negative for chills, diaphoresis, fatigue and fever.  HENT: Positive for congestion, ear discharge and ear pain. Negative for postnasal drip, rhinorrhea, sinus pressure, sinus pain and sore throat.     There are no active problems to display for this patient.   Current Outpatient Medications on File Prior to Visit  Medication Sig Dispense Refill  . Azelastine HCl 0.15 % SOLN Place 2 sprays into both nostrils 2 (two) times daily. (Patient not taking: Reported on 01/15/2017) 30 mL 0  . benzonatate (TESSALON) 100 MG capsule Take 1-2 capsules (100-200 mg total) by mouth 3 (three) times daily as needed for cough. (Patient not taking: Reported on 01/15/2017) 40 capsule 0  . Guaifenesin (MUCINEX MAXIMUM STRENGTH) 1200 MG TB12 Take 1 tablet (1,200 mg total) by mouth every 12 (twelve) hours as needed. (Patient not taking: Reported on 01/15/2017) 14 tablet 1   No current facility-administered medications on file prior to visit.     No Known Allergies   Objective:  BP (!) 104/56   Pulse 94   Temp 98.7 F (37.1 C) (Oral)   Resp 16   Ht 5\' 1"  (1.549 m)   Wt 119 lb 9.6 oz (54.3 kg)   LMP 01/11/2017   SpO2 98%   BMI 22.60 kg/m   Physical Exam  Constitutional: She is oriented to person, place, and time and well-developed, well-nourished, and in no distress. No distress.  HENT:  Right Ear: Tympanic membrane normal.  Left Ear: There is swelling (ear canal. TTP) and tenderness (Tragus TTP). No drainage. No mastoid tenderness. Tympanic membrane is bulging.  Nose: Mucosal  edema present. No rhinorrhea. Right sinus exhibits no maxillary sinus tenderness and no frontal sinus tenderness. Left sinus exhibits no maxillary sinus tenderness and no frontal sinus tenderness.  Mouth/Throat: Oropharynx is clear and moist and mucous membranes are normal.  Cardiovascular: Normal rate, regular rhythm and normal heart sounds.  Pulmonary/Chest: Effort normal and breath sounds normal. No respiratory distress. She has no wheezes. She has no rales.  Neurological: She is alert and oriented to person, place, and time. GCS score is 15.  Skin: Skin is warm and dry.  Psychiatric: Mood, memory, affect and judgment normal.  Vitals reviewed.   Assessment and Plan :  1. Left ear pain 2. Acute otitis externa of left ear, unspecified type - ofloxacin (FLOXIN OTIC) 0.3 % OTIC solution; Place 5 drops into the left ear daily for 7 days.  Dispense: 5 mL; Refill: 0 - amoxicillin (AMOXIL) 875 MG tablet; Take 1 tablet (875 mg total) by mouth 2 (two) times daily for 7 days.  Dispense: 14 tablet; Refill: 0 - Plan to treat for otitis externa and will cover for otitis media. RTC in 1 week for recheck.   Marco CollieWhitney Smera Guyette, PA-C  Primary Care at [facility] 01/15/2017 10:50 AM

## 2017-01-15 NOTE — Patient Instructions (Addendum)
Start taking your antibiotics. Take the entire course, even if you start to feel better. Come back and see me in 1 week.    Otitis Externa Otitis externa is an infection of the outer ear canal. The outer ear canal is the area between the outside of the ear and the eardrum. Otitis externa is sometimes called "swimmer's ear." What are the causes? This condition may be caused by:  Swimming in dirty water.  Moisture in the ear.  An injury to the inside of the ear.  An object stuck in the ear.  A cut or scrape on the outside of the ear.  What increases the risk? This condition is more likely to develop in swimmers. What are the signs or symptoms? The first symptom of this condition is often itching in the ear. Later signs and symptoms include:  Swelling of the ear.  Redness in the ear.  Ear pain. The pain may get worse when you pull on your ear.  Pus coming from the ear.  How is this diagnosed? This condition may be diagnosed by examining the ear and testing fluid from the ear for bacteria and funguses. How is this treated? This condition may be treated with:  Antibiotic ear drops. These are often given for 10-14 days.  Medicine to reduce itching and swelling.  Follow these instructions at home:  If you were prescribed antibiotic ear drops, apply them as told by your health care provider. Do not stop using the antibiotic even if your condition improves.  Take over-the-counter and prescription medicines only as told by your health care provider.  Keep all follow-up visits as told by your health care provider. This is important. How is this prevented?  Keep your ear dry. Use the corner of a towel to dry your ear after you swim or bathe.  Avoid scratching or putting things in your ear. Doing these things can damage the ear canal or remove the protective wax that lines it, which makes it easier for bacteria and funguses to grow.  Avoid swimming in lakes, polluted water,  or pools that may not have the right amount of chlorine.  Consider making ear drops and putting 3 or 4 drops in each ear after you swim. Ask your health care provider about how you can make ear drops. Contact a health care provider if:  You have a fever.  After 3 days your ear is still red, swollen, painful, or draining pus.  Your redness, swelling, or pain gets worse.  You have a severe headache.  You have redness, swelling, pain, or tenderness in the area behind your ear. This information is not intended to replace advice given to you by your health care provider. Make sure you discuss any questions you have with your health care provider. Document Released: 01/21/2005 Document Revised: 02/28/2015 Document Reviewed: 10/31/2014 Elsevier Interactive Patient Education  [employer].  Thank you for coming in today. I hope you feel we met your needs.  Feel free to call PCP if you have any questions or further requests.  Please consider signing up for MyChart if you do not already have it, as this is a great way to communicate with me.  Best,  Whitney McVey, PA-C  IF you received an x-ray today, you will receive an invoice from [facility]. Please contact [facility] at [phone] with questions or concerns regarding your invoice.   IF you received labwork today, you will receive an invoice from [facility]. Please contact [facility]  at [phone] with questions or concerns regarding your invoice.   Our billing staff will not be able to assist you with questions regarding bills from these companies.  You will be contacted with the lab results as soon as they are available. The fastest way to get your results is to activate your My Chart account. Instructions are located on the last page of this paperwork. If you have not heard from [place] regarding the results in 2 weeks, please contact this office.

## 2017-01-17 ENCOUNTER — Telehealth: Payer: Self-pay | Admitting: Physician Assistant

## 2017-01-17 ENCOUNTER — Other Ambulatory Visit: Payer: Self-pay | Admitting: Physician Assistant

## 2017-01-17 ENCOUNTER — Other Ambulatory Visit: Payer: Self-pay

## 2017-01-17 ENCOUNTER — Ambulatory Visit: Payer: BLUE CROSS/BLUE SHIELD | Admitting: Physician Assistant

## 2017-01-17 ENCOUNTER — Encounter: Payer: Self-pay | Admitting: Physician Assistant

## 2017-01-17 VITALS — BP 110/68 | HR 86 | Temp 98.4°F | Resp 16 | Ht 61.0 in | Wt 118.0 lb

## 2017-01-17 DIAGNOSIS — H60392 Other infective otitis externa, left ear: Secondary | ICD-10-CM | POA: Diagnosis not present

## 2017-01-17 LAB — POCT GLYCOSYLATED HEMOGLOBIN (HGB A1C): HEMOGLOBIN A1C: 4.9

## 2017-01-17 LAB — POCT SKIN KOH: SKIN KOH, POC: NEGATIVE

## 2017-01-17 NOTE — Telephone Encounter (Signed)
Patient's husband is calling to update the provider- he thinks that his wife is not improving. They are using the ear drops and the antibiotics and she is not getting better. Patient feels like she is still feverish- they do not have thermometer at home. This is her third day on antibiotic- she is not having any improvement- patient is stating she is having some swelling and it is getting worse.They are requesting an appointment today- appointment for recheck given.

## 2017-01-17 NOTE — Progress Notes (Unsigned)
    01/17/2017 2:10 PM   DOB: 1985/11/17 / MRN: [id number]  SUBJECTIVE:  Mia Pierce is a 31 y.o. female presenting for   She has No Known Allergies.   She  has a past medical history of Abnormal maternal serum screening test (02/14/2015), High risk pregnancy with low PAPPA (02/14/2015), and SVD (spontaneous vaginal delivery) (07/13/2015).    She  reports that  has never smoked. she has never used smokeless tobacco. She reports that she does not drink alcohol or use drugs. She  has no sexual activity history on file. The patient  has no past surgical history on file.  Her family history is not on file.  ROS  The problem list and medications were reviewed and updated by myself where necessary and exist elsewhere in the encounter.   OBJECTIVE:  LMP 01/11/2017   Physical Exam  Results for orders placed or performed in visit on 01/17/17 (from the past 72 hour(s))  POCT Skin KOH     Status: None   Collection Time: 01/17/17  1:41 PM  Result Value Ref Range   Skin KOH, POC Negative Negative  POCT glycosylated hemoglobin (Hb A1C)     Status: None   Collection Time: 01/17/17  1:56 PM  Result Value Ref Range   Hemoglobin A1C 4.9     No results found.  ASSESSMENT AND PLAN:  There are no diagnoses linked to this encounter.  The patient is advised to call or return to clinic if she does not see an improvement in symptoms, or to seek the care of the closest emergency department if she worsens with the above plan.   Deliah BostonMichael Malori Myers, MHS, PA-C Primary Care at [facility] 01/17/2017 2:10 PM

## 2017-01-17 NOTE — Progress Notes (Signed)
01/17/2017 2:18 PM   DOB: 1985-08-17 / MRN: [id number]  SUBJECTIVE:  Mia Pierce is a 31 y.o. female presenting for follow up of left ear pain.  She has been taking abx drops and amox and is not feeling any better. Husband tells me "I can't keep the drop in her ear due to the swelling."   She has No Known Allergies.   She  has a past medical history of Abnormal maternal serum screening test (02/14/2015), High risk pregnancy with low PAPPA (02/14/2015), and SVD (spontaneous vaginal delivery) (07/13/2015).    She  reports that  has never smoked. she has never used smokeless tobacco. She reports that she does not drink alcohol or use drugs. She  has no sexual activity history on file. The patient  has no past surgical history on file.  Her family history is not on file.  Review of Systems  Constitutional: Negative for chills, diaphoresis and fever.  Eyes: Negative.   Respiratory: Negative for cough, hemoptysis, sputum production, shortness of breath and wheezing.   Cardiovascular: Negative for chest pain, orthopnea and leg swelling.  Gastrointestinal: Negative for nausea.  Skin: Negative for rash.  Neurological: Negative for dizziness, sensory change, speech change, focal weakness and headaches.    The problem list and medications were reviewed and updated by myself where necessary and exist elsewhere in the encounter.   OBJECTIVE:  BP 110/68 (BP Location: Left Arm, Patient Position: Sitting, Cuff Size: Normal)   Pulse 86   Temp 98.4 F (36.9 C) (Oral)   Resp 16   Ht 5\' 1"  (1.549 m)   Wt 118 lb (53.5 kg)   LMP 01/11/2017   SpO2 98%   BMI 22.30 kg/m   Physical Exam  Constitutional: She is active.  Non-toxic appearance.  HENT:  Left Ear: There is tenderness. No mastoid tenderness.  TM not viewable 2/2 canal swelling and pus  Cardiovascular: Normal rate, regular rhythm, S1 normal, S2 normal, normal heart sounds and intact distal pulses. Exam reveals no gallop, no friction rub and  no decreased pulses.  No murmur heard. Pulmonary/Chest: Effort normal. No stridor. No tachypnea. No respiratory distress. She has no wheezes. She has no rales.  Abdominal: She exhibits no distension.  Musculoskeletal: She exhibits no edema.  Neurological: She is alert.  Skin: Skin is warm and dry. She is not diaphoretic. No pallor.    Results for orders placed or performed in visit on 01/17/17 (from the past 72 hour(s))  POCT Skin KOH     Status: None   Collection Time: 01/17/17  1:41 PM  Result Value Ref Range   Skin KOH, POC Negative Negative  POCT glycosylated hemoglobin (Hb A1C)     Status: None   Collection Time: 01/17/17  1:56 PM  Result Value Ref Range   Hemoglobin A1C 4.9     No results found.  ASSESSMENT AND PLAN:  Mia Pierce was seen today for ear pain.  Diagnoses and all orders for this visit:  Infective otitis externa of left ear: No alarm features.  I have placed a wick in the ear and she will come back on Tuesday for recheck.  -     POCT glycosylated hemoglobin (Hb A1C) -     POCT Skin KOH    The patient is advised to call or return to clinic if she does not see an improvement in symptoms, or to seek the care of the closest emergency department if she worsens with the above plan.  Deliah BostonMichael Mariadelosang Wynns, MHS, PA-C Primary Care at [facility] 01/17/2017 2:18 PM

## 2017-01-21 ENCOUNTER — Encounter: Payer: Self-pay | Admitting: Physician Assistant

## 2017-01-21 ENCOUNTER — Ambulatory Visit: Payer: BLUE CROSS/BLUE SHIELD | Admitting: Physician Assistant

## 2017-01-21 ENCOUNTER — Other Ambulatory Visit: Payer: Self-pay

## 2017-01-21 VITALS — BP 106/80 | HR 90 | Temp 98.2°F | Resp 16 | Ht 61.0 in | Wt 118.4 lb

## 2017-01-21 DIAGNOSIS — H6092 Unspecified otitis externa, left ear: Secondary | ICD-10-CM | POA: Diagnosis not present

## 2017-01-21 NOTE — Patient Instructions (Signed)
     IF you received an x-ray today, you will receive an invoice from [facility]. Please contact [facility] at [phone] with questions or concerns regarding your invoice.   IF you received labwork today, you will receive an invoice from [facility]. Please contact [facility] at [phone] with questions or concerns regarding your invoice.   Our billing staff will not be able to assist you with questions regarding bills from these companies.  You will be contacted with the lab results as soon as they are available. The fastest way to get your results is to activate your My Chart account. Instructions are located on the last page of this paperwork. If you have not heard from us regarding the results in 2 weeks, please contact this office.     

## 2017-01-21 NOTE — Progress Notes (Signed)
   Mia Pierce  MRN: [id number] DOB: 1985-05-16  PCP: Patient, No Pcp Per  Subjective:  Pt is a 31 year old female who presents to clinic for f/u left ear pain. Speaks [place], here today with her husband who is helping to interpret.  She was here for this problem 12/12 and received oral and otic abx. She returned two days later for worsening pain. A wick was placed in her ear due to increased swelling. Today she states she is improving. Ear wick came out last night. Endorses medication compliance. She has one day left of abx. Denies medication side effects.   Review of Systems  Constitutional: Negative for chills, diaphoresis, fatigue and fever.  HENT: Positive for ear pain. Negative for ear discharge, rhinorrhea, sinus pressure and sinus pain.     There are no active problems to display for this patient.   Current Outpatient Medications on File Prior to Visit  Medication Sig Dispense Refill  . amoxicillin (AMOXIL) 875 MG tablet Take 1 tablet (875 mg total) by mouth 2 (two) times daily for 7 days. 14 tablet 0  . ofloxacin (FLOXIN OTIC) 0.3 % OTIC solution Place 5 drops into the left ear daily for 7 days. 5 mL 0   No current facility-administered medications on file prior to visit.     No Known Allergies   Objective:  BP 106/80   Pulse 90   Temp 98.2 F (36.8 C) (Oral)   Resp 16   Ht 5\' 1"  (1.549 m)   Wt 118 lb 6.4 oz (53.7 kg)   LMP 01/11/2017   SpO2 99%   BMI 22.37 kg/m   Physical Exam  Constitutional: She is well-developed, well-nourished, and in no distress.  HENT:  Right Ear: Tympanic membrane, external ear and ear canal normal.  Left Ear: No drainage, swelling or tenderness.  Swelling has improved. No tenderness with insertion of scope into ear canal. Unable to visualize TM due to debris.   Skin: Skin is warm and dry.  Psychiatric: Mood, memory, affect and judgment normal.  Vitals reviewed.   Assessment and Plan :  1. Otitis externa of left ear, unspecified  chronicity, unspecified type -Pt is improving. Ear wick came out last night. Finish course of antibiotics. RTC if symptoms return.  She understands and agrees.    Marco CollieWhitney Tomothy Eddins, PA-C  Primary Care at [facility] 01/21/2017 10:21 AM

## 2017-03-06 ENCOUNTER — Ambulatory Visit: Payer: BLUE CROSS/BLUE SHIELD | Admitting: Physician Assistant

## 2017-03-06 ENCOUNTER — Other Ambulatory Visit: Payer: Self-pay

## 2017-03-06 ENCOUNTER — Encounter: Payer: Self-pay | Admitting: Physician Assistant

## 2017-03-06 VITALS — BP 108/86 | HR 84 | Temp 97.6°F | Ht 62.0 in | Wt 121.0 lb

## 2017-03-06 DIAGNOSIS — R059 Cough, unspecified: Secondary | ICD-10-CM

## 2017-03-06 DIAGNOSIS — R05 Cough: Secondary | ICD-10-CM | POA: Diagnosis not present

## 2017-03-06 DIAGNOSIS — R6889 Other general symptoms and signs: Secondary | ICD-10-CM | POA: Diagnosis not present

## 2017-03-06 LAB — POC INFLUENZA A&B (BINAX/QUICKVUE)
Influenza A, POC: NEGATIVE
Influenza B, POC: NEGATIVE

## 2017-03-06 MED ORDER — BALOXAVIR MARBOXIL(40 MG DOSE) 2 X 20 MG PO TBPK: 40.0000 mg | ORAL_TABLET | Freq: Once | ORAL | 0 refills | Status: AC

## 2017-03-06 NOTE — Patient Instructions (Addendum)
Stay well hydrated. Get lost of rest. Wash your hands often. Come back if you are not getting better in 5-7 days.   -Foods that can help speed recovery: honey, garlic, chicken soup, elderberries, green tea.  -Supplements that can help speed recovery: vitamin C, zinc, elderberry extract, quercetin, ginseng, selenium -Supplement with prebiotics and probiotics:   Advil or ibuprofen for pain. Do not take Aspirin.  Drink enough water and fluids to keep your urine clear or pale yellow.  For sore throat: ? Gargle with 8 oz of salt water ( tsp of salt per 1 qt of water) as often as every 1-2 hours to soothe your throat.  Gargle liquid benadryl.  Cepacol throat lozenges (if you are not at risk for choking).  For sore throat try using a honey-based tea. Use 3 teaspoons of honey with juice squeezed from half lemon. Place shaved pieces of ginger into 1/2-1 cup of water and warm over stove top. Then mix the ingredients and repeat every 4 hours as needed.  Cough Syrup Recipe: Sweet Lemon & Honey Thyme  Ingredients a handful of fresh thyme sprigs   1 pint of water (2 cups)  1/2 cup honey (raw is best, but regular will do)  1/2 lemon chopped Instructions 1. Place the lemon in the pint jar and cover with the honey. The honey will macerate the lemons and draw out liquids which taste so delicious! 2. Meanwhile, toss the thyme leaves into a saucepan and cover them with the water. 3. Bring the water to a gentle simmer and reduce it to half, about a cup of tea. 4. When the tea is reduced and cooled a bit, strain the sprigs & leaves, add it into the pint jar and stir it well. 5. Give it a shake and use a spoonful as needed. 6. Store your homemade cough syrup in the refrigerator for about a month.  Is there anything I can do on my own to get rid of my cough? Yes. To help get rid of your cough, you can: ?Use a humidifier in your bedroom ?Use an over-the-counter cough medicine, or suck on cough drops or  hard candy ?Stop smoking, if you smoke ?If you have allergies, avoid the things you are allergic to (like pollen, dust, animals, or mold) If you have acid reflux, your doctor or nurse will tell you which lifestyle changes can help reduce symptoms.    Thank you for coming in today. I hope you feel we met your needs.  Feel free to call PCP if you have any questions or further requests.  Please consider signing up for MyChart if you do not already have it, as this is a great way to communicate with me.  Best,  Whitney McVey, PA-C   IF you received an x-ray today, you will receive an invoice from [facility]. Please contact [facility] at [phone] with questions or concerns regarding your invoice.   IF you received labwork today, you will receive an invoice from [place]. Please contact [facility] at [phone] with questions or concerns regarding your invoice.   Our billing staff will not be able to assist you with questions regarding bills from these companies.  You will be contacted with the lab results as soon as they are available. The fastest way to get your results is to activate your My Chart account. Instructions are located on the last page of this paperwork. If you have not heard from [place] regarding the results in 2 weeks, please  contact this office.

## 2017-03-06 NOTE — Progress Notes (Signed)
   Darliss RidgelLis Basich  MRN: [id number] DOB: [birth year]  PCP: Patient, No Pcp Per  Subjective:  Pt is a 32 year old female who presents to clinic for flu-like symptoms x 2 days. Endorses hody aches, cough, runny nose. She has taken Theraflu and tylenol - not helping.  No known sick contacts.     Review of Systems  Constitutional: Positive for fatigue. Negative for chills, diaphoresis and fever.  HENT: Positive for congestion, rhinorrhea, sinus pressure and sore throat. Negative for postnasal drip and sinus pain.   Respiratory: Positive for cough. Negative for shortness of breath and wheezing.   Psychiatric/Behavioral: Positive for sleep disturbance.    There are no active problems to display for this patient.   No current outpatient medications on file prior to visit.   No current facility-administered medications on file prior to visit.     No Known Allergies   Objective:  BP 108/86 (BP Location: Left Arm, Patient Position: Sitting, Cuff Size: Normal)   Pulse 84   Temp 97.6 F (36.4 C) (Oral)   Ht 5\' 2"  (1.575 m)   Wt 121 lb (54.9 kg)   SpO2 97%   BMI 22.13 kg/m   Physical Exam  Constitutional: She is oriented to person, place, and time and well-developed, well-nourished, and in no distress. No distress.  HENT:  Right Ear: Tympanic membrane normal.  Left Ear: Tympanic membrane normal.  Nose: Mucosal edema present. No rhinorrhea. Right sinus exhibits no maxillary sinus tenderness and no frontal sinus tenderness. Left sinus exhibits no maxillary sinus tenderness and no frontal sinus tenderness.  Mouth/Throat: Oropharynx is clear and moist and mucous membranes are normal.  Cardiovascular: Normal rate, regular rhythm and normal heart sounds.  Pulmonary/Chest: Effort normal and breath sounds normal. No respiratory distress. She has no wheezes. She has no rales.  Neurological: She is alert and oriented to person, place, and time. GCS score is 15.  Skin: Skin is warm and dry.    Psychiatric: Mood, memory, affect and judgment normal.  Vitals reviewed.   Results for orders placed or performed in visit on 03/06/17  POC Influenza A&B(BINAX/QUICKVUE)  Result Value Ref Range   Influenza A, POC Negative Negative   Influenza B, POC Negative Negative    Assessment and Plan :  1. Flu-like symptoms 2. Cough - POC Influenza A&B(BINAX/QUICKVUE) - Baloxavir Marboxil 40 MG Dose (XOFLUZA) 20 (2) MG TBPK; Take 40 mg by mouth once for 1 dose.  Dispense: 2 each; Refill: 0 - pt presents for flu-like symptoms x 2 days. Suspect false negative. Plan to treat with xofluza. RTC in 5-7 days if no improvement.   Marco CollieWhitney Izaac Reisig, PA-C  Primary Care at [facility] 03/06/2017 10:55 AM

## 2017-04-10 ENCOUNTER — Other Ambulatory Visit: Payer: Self-pay

## 2017-04-10 ENCOUNTER — Ambulatory Visit: Payer: BLUE CROSS/BLUE SHIELD | Admitting: Family Medicine

## 2017-04-10 ENCOUNTER — Encounter: Payer: Self-pay | Admitting: Family Medicine

## 2017-04-10 VITALS — BP 110/70 | HR 103 | Temp 98.2°F | Resp 16 | Ht 62.0 in | Wt 118.0 lb

## 2017-04-10 DIAGNOSIS — J019 Acute sinusitis, unspecified: Secondary | ICD-10-CM

## 2017-04-10 DIAGNOSIS — R6889 Other general symptoms and signs: Secondary | ICD-10-CM

## 2017-04-10 DIAGNOSIS — B9689 Other specified bacterial agents as the cause of diseases classified elsewhere: Secondary | ICD-10-CM

## 2017-04-10 LAB — POC INFLUENZA A&B (BINAX/QUICKVUE)
INFLUENZA B, POC: NEGATIVE
Influenza A, POC: NEGATIVE

## 2017-04-10 MED ORDER — FLUTICASONE PROPIONATE 50 MCG/ACT NA SUSP: 2.0000 | Freq: Every day | NASAL | 6 refills | Status: DC

## 2017-04-10 MED ORDER — FLUCONAZOLE 150 MG PO TABS: 150.0000 mg | ORAL_TABLET | Freq: Once | ORAL | 0 refills | Status: AC

## 2017-04-10 MED ORDER — AMOXICILLIN-POT CLAVULANATE 875-125 MG PO TABS: 1.0000 | ORAL_TABLET | Freq: Two times a day (BID) | ORAL | 0 refills | Status: DC

## 2017-04-10 MED ORDER — GUAIFENESIN ER 600 MG PO TB12: 600.0000 mg | ORAL_TABLET | Freq: Two times a day (BID) | ORAL | 0 refills | Status: DC

## 2017-04-10 NOTE — Patient Instructions (Addendum)
Antibiotic - You have received the antibiotic Augmentin. It may take several days before you feel any benefit from this medicine. Be sure to take all the medication prescribed, even if you are feeling better before it is finished. Do not drink alcohol while taking antibiotics.  It is unknown whether you will develop an adverse reaction (diarrhea, rash, nausea or vomiting) or an allergic reaction (trouble breathing, anaphylaxis).    For Females on birth control medications: Please use back up birth control (condoms) as some antibiotics can decrease the effectiveness of birth control and increase the chance of pregnancy.   Take yeast medication diflucan if you get itching in your vagina after taking the antibiotic Augmentin     IF you received an x-ray today, you will receive an invoice from [facility]. Please contact [facility] at [phone] with questions or concerns regarding your invoice.   IF you received labwork today, you will receive an invoice from [place]. Please contact [facility] at [phone] with questions or concerns regarding your invoice.   Our billing staff will not be able to assist you with questions regarding bills from these companies.  You will be contacted with the lab results as soon as they are available. The fastest way to get your results is to activate your My Chart account. Instructions are located on the last page of this paperwork. If you have not heard from [place] regarding the results in 2 weeks, please contact this office.     Vim xoang, Ng??i l?n Sinusitis, Adult Vim xoang l hi?n t??ng ?au nh?c v vim ? cc xoang m?i. Xoang la? nh??ng khoa?ng tr?ng nng trong x??ng quanh m??t quy? vi?. Cc xoang n??m ??:  Xung quanh m?t.  ?? gi??a tra?n.  Phi?a sau mu?i.  ?? x??ng go? ma? cu?a quy? vi?.  Xoang va? h?c mu?i co? n?p nh?n ch??a ch?t lo?ng ???c qua?nh (di?ch nh?y). Di?ch nh?y th???ng ti?t ra t?? xoang cu?a quy? vi?. Khi m  mu?i cu?a quy? vi? bi? vim ho??c s?ng, di?ch nh?y co? th? bi? ke?t la?i ho??c t??c nn khng khi? khng th? ?i qua ca?c xoang cu?a quy? vi?. Ti?nh tra?ng na?y cho phe?p vi khu?n, vi ru?t va? n?m pha?t tri?n, ?i?u na?y d?n ??n nhi?m tru?ng. Vim xoang co? th? pha?t tri?n nhanh cho?ng va? ke?o da?i 7?10 nga?y (c?p ti?nh) ho??c h?n 12 tu?n (ma?n ti?nh). Vim xoang co? th? pha?t tri?n sau khi bi? ca?m la?nh. Nguyn nhn g gy ra? Ti?nh tra?ng na?y co? th? xa?y ra do b?t ky? nguyn nhn gi? la?m s?ng bn trong ca?c xoang ho??c ng?n khng cho d?ch nh?y ch?y ra, bao g?m:  D? ?ng.  Hen suy?n.  Nhi?m vi khu?n ho??c vi ru?t.  Ca?c x??ng co? hi?nh da?ng b?t th???ng gi??a ca?c h?c mu?i.  Kh?i u ?? mu?i co? ch??a di?ch nh?y (polip mu?i).  L? xoang he?p.  Ca?c ch?t gy  nhi?m, ch??ng ha?n nh? ho?a ch?t ho??c cc ch?t ki?ch thi?ch trong khng khi?Marland Kitchen  M?t di? v?t t??c trong mu?i.  Nhi?m n?m. V?n ?? ny hi?m khi x?y ra.  ?i?u g lm t?ng nguy c?? Nh?ng y?u t? sau c th? lm qu v? d? b? tnh tr?ng ny h?n:  Bi? di? ??ng ho??c hen suy?n.  G?n ?y b? ca?m la?nh ho??c nhi?m trng ???ng h h?p.  Co? bi?n da?ng c?u trc ho??c t??c trong mu?i ho??c trong xoang.  C h? mi?n d?ch b? y?u.  B?i ho??c l??n nhi?u.  Du?ng qua? nhi?u thu?c xi?t  mu?i.  Ht thu?c.  Cc d?u hi?u ho?c tri?u ch?ng l g? Cc tri?u ch?ng chnh cu?a ti?nh tra?ng na?y l ?au v c?m gic n?ng xung quanh cc xoang b? ?nh h??ng. Cc tri?u ch?ng khc bao g?m:  ?au r?ng hm trn.  ?au tai.  ?au ??u.  H?i th? hi.  Suy gi?m thnh gic v v? gic.  Ho co? th? nhi?u h?n va?o ban ?m.  M?t m?i.  S?t.  Ch?y di?ch ??c ?? mu?i quy? vi?. D?ch ch?y ra th??ng c mu xanh v c th? c m? (m?).  Nghe?t m?i ho?c sung huy?t.  S? mu?i pha sau. ?o? la? khi co? thm di?ch nh?y ti?ch tu? ?? ho?ng ho??c ?? thnh sau mu?i.  S?ng v ?m h?n ? cc xoang b? ?nh h??ng.  ?au h?ng.  Nh?y c?m  v?i nh sng.  Ch?n ?on tnh tr?ng ny nh? th? no? Tnh tr?ng ny c th? ???c ch?n ?on d?a vo tri?u ch??ng, khai thc b?nh s? v khm th?c th?. ?? ti?m hi?u xem ti?nh tra?ng na?y la? c?p ti?nh hay ma?n ti?nh, chuyn gia ch?m so?c s??c kho?e cu?a quy? vi? co? th?:  Nhi?n va?o mu?i quy? vi? xem co? d?u hi?u polip mu?i hay khng.  G vo cc xoang b? ?nh h??ng ?? ki?m tra d?u hi?u nhi?m trng.  Khm bn trong xoang c?a qu v? b?ng cch s? d?ng m?t thi?t b? t?o ?nh c g?n ?n ? ??u (n?i soi).  N?u chuyn gia ch?m so?c s??c kho?e cu?a quy? vi? nghi ng?? quy? vi? bi? vim xoang ma?n ti?nh, quy? vi? cu?ng co? th?:  ???c ki?m tra di? ??ng.  L?y m?u di?ch nh?y ?? mu?i (c?y di?ch nh?y mu?i) va? ki?m tra xem co? vi khu?n khng.  Cho ki?m tra m?u di?ch nh?y ?? xem b?nh vim xoang cu?a quy? vi? co? lin quan ??n di? ??ng hay khng.  N?u b?nh vim xoang cu?a quy? vi? khng ?a?p ??ng v??i ?i?u tri? va? no? ke?o da?i h?n 8 tu?n, quy? vi? co? th? ???c chu?p MRI ho??c CT ?? ki?m tra xoang cu?a quy? vi?. Vi?c chu?p na?y cu?ng giu?p xa?c ?i?nh nhi?m tru?ng cu?a quy? vi? n?ng ??n m?c na?o. Trong ca?c tr???ng h??p hi?m g??p, quy? vi? co? th? c?n la?m sinh thi?t x??ng ?? loa?i tr? nh??ng loa?i b?nh xoang do nhi?m n?m nghim tro?ng h?n. Tnh tr?ng ny ???c ?i?u tr? nh? th? no? Vi?c ?i?u tri? vim xoang tu?y thu?c va?o nguyn nhn va? ti?nh tra?ng cu?a quy? vi? la? ma?n ti?nh hay c?p ti?nh. N?u vim xoang cu?a quy? vi? do vi ru?t gy ra, ca?c tri?u ch??ng cu?a quy? vi? se? t?? h?t trong vo?ng 10 nga?y. Quy? vi? cu?ng co? th? ????c cho du?ng thu?c ?? gia?m nhe? ca?c tri?u ch??ng, bao g?m:  Thu?c ch?ng sung huy?t mu?i t?i ch?. Thu?c na?y la?m ca?c h?c mu?i s?ng co la?i va? ?? cho di?ch nh?y ch?y ra kh?i ca?c xoang.  Thu?c khng histamine. Nh??ng thu?c na?y ng?n ti?nh tra?ng vim do di? ??ng gy nn. Vi?c na?y co? th? giu?p gia?m s?ng ?? mu?i va? xoang cu?a quy?  vi?.  Cc thu?c corticosteroid xi?t mu?i t?i ch?. ?y la? thu?c xi?t mu?i la?m gia?m vim va? gi?m s?ng ?? mu?i va? xoang cu?a quy? vi?.  N???c mu?i sinh l r??a mu?i. Nh??ng lo?i n???c r??a na?y co? th? giu?p loa?i bo? di?ch nh?y ???c trong mu?i quy? vi?.  N?u ti?nh tra?ng cu?a quy? vi? do vi khu?n gy ra, quy? vi?  se? ????c cho du?ng thu?c kha?ng sinh. N?u ti?nh tra?ng cu?a quy? vi? do n?m gy ra, quy? vi? se? ????c cho du?ng thu?c kha?ng n?m. Ph?u thu?t co? th? c?n thi?t ?? ?i?u tri? tnh tr?ng b?nh ly? lin quan, ch??ng ha?n nh? he?p h?c mu?i. Ph?u thu?t cu?ng co? th? c?n thi?t ?? c??t bo? polip. Tun th? nh?ng h??ng d?n ny ? nh: Thu?c  Ch? u?ng, dng ho?c bi thu?c khng k ??n v thu?c k ??n theo ch? d?n c?a chuyn gia ch?m [place] s?c kh?e. Nh??ng thu?c na?y bao g?m ca? thu?c xi?t mu?i.  N?u qu v? ???c k thu?c khng sinh, hy dng thu?c theo ch? d?n c?a chuyn gia ch?m [place] s?c kh?e. Khng d?ng u?ng thu?c khng sinh ngay c? khi qu v? b?t ??u c?m th?y ?? h?n. U?ng ?u? n???c va? la?m ?m  U?ng ?? n??c ?? n??c ti?u trong ho?c c mu vng nh?t. Gi?? cho c? th? ?u? n???c se? giu?p la?m loa?ng di?ch nh?y.  S?? du?ng m?t ma?y ta?o s??ng mu? la?m ?m ma?t ?? gi?? ?? ?m trong nha? quy? vi? ?? m??c trn 50%.  Hi?t h?i n???c trong 10-15 phu?t, 3-4 m?i nga?y ho??c theo chi? d?n cu?a chuyn gia ch?m so?c s??c kho?e. Quy? vi? c th? la?m vi?c na?y trong pho?ng t??m khi vo?i sen n???c no?ng ?ang cha?y.  Ha?n ch? ti?p xu?c v??i khng khi? la?nh ho??c kh. Ngh? ng?i  Ngh? ng?i cng nhi?u cng t?t.  Ngu? g?i ??u cao (nng cao).  Ba?o ?a?m ngu? ?u? gi?c va?o m?i ?m. H??ng d?n chung  Ch??m kh?n ?m, ?m ln m?t 3-4 l?n m?i ngy ho?c theo ch? d?n c?a chuyn gia ch?m [place] s?c kh?e. Vi?c na?y se? giu?p gia?m c?m gic kho? chi?u.  R??a tay th???ng xuyn b??ng xa? pho?ng va? n???c ?? gia?m ti?p xu?c v??i vi ru?t va? ca?c vi tru?ng kha?c. N?u khng c x  phng v n??c, hy dng thu?c st trng tay.  Khng ht thu?c. Tra?nh ?? ca?nh nh??ng ng???i hu?t thu?c (kho?i thu?c thu? ??ng).  Tun th? t?t c? cc l?n khm theo di theo ch? d?n c?a chuyn gia ch?m [place] s?c kh?e. ?i?u ny c vai tr quan tr?ng. Hy lin l?c v?i chuyn gia ch?m [place] s?c kh?e n?u:  Qu v? b? s?t.  Tri?u ch?ng c?a qu v? tr?m tr?ng h?n.  Tri?u ch?ng c?a qu v? khng c?i thi?n trong 10 ngy. Yu c?u tr? gip ngay l?p t?c n?u:  Qu v? b? ?au ??u nhi?u.  Qu v? lin t?c nn m?a.  Qu v? b? ?au ho??c s?ng xung quanh m?t ho??c m??t.  Qu v? c v?n ?? v? th? l?c.  Qu v? b? l l?n.  C? qu v? b? c?ng.  Qu v? b? kh th?. Thng tin ny khng nh?m m?c ?ch thay th? cho l?i khuyn m chuyn gia ch?m [place] s?c kh?e ni v?i qu v?. Hy b?o ??m qu v? ph?i th?o lu?n b?t k? v?n ?? g m qu v? c v?i chuyn gia ch?m [place] s?c kh?e c?a qu v?. Document Released: 07/23/2011 Document Revised: 05/09/2016 Document Reviewed: 11/16/2014 Elsevier Interactive Patient Education  2018 [employer].

## 2017-04-10 NOTE — Progress Notes (Signed)
Chief Complaint  Patient presents with  . Influenza    ? flu, onset: 04/07/17, warm to touch but no temps taken, bodyaches, chills, st and ha's.  Taking theraflu with no relief, tylenol last taken this morning    HPI   Onset 04/07/17 Symptoms: Coughing, sneezing, myalgia, headache, dizziness, sore throat, frontal headache,  Taking theraflu and tylenol without relief No sick contacts Works in a [employer] No nausea, vomiting, diarrhea   Past Medical History:  Diagnosis Date  . Abnormal maternal serum screening test 02/14/2015   1:6 Down syndrome risk from first trimester screen; NIPS drawn 1/10   . High risk pregnancy with low PAPPA 02/14/2015   0.28 MoM   . SVD (spontaneous vaginal delivery) 07/13/2015    Current Outpatient Medications  Medication Sig Dispense Refill  . amoxicillin-clavulanate (AUGMENTIN) 875-125 MG tablet Take 1 tablet by mouth 2 (two) times daily. 20 tablet 0  . fluconazole (DIFLUCAN) 150 MG tablet Take 1 tablet (150 mg total) by mouth once for 1 dose. Repeat second dose in 3 days 2 tablet 0  . fluticasone (FLONASE) 50 MCG/ACT nasal spray Place 2 sprays into both nostrils daily. 16 g 6  . guaiFENesin (MUCINEX) 600 MG 12 hr tablet Take 1 tablet (600 mg total) by mouth 2 (two) times daily. 60 tablet 0   No current facility-administered medications for this visit.     Allergies: No Known Allergies  No past surgical history on file.  Social History   Socioeconomic History  . Marital status: Married    Spouse name: None  . Number of children: 2  . Years of education: None  . Highest education level: None  Social Needs  . Financial resource strain: None  . Food insecurity - worry: None  . Food insecurity - inability: None  . Transportation needs - medical: None  . Transportation needs - non-medical: None  Occupational History  . Occupation: Advertising account plannernail technician  Tobacco Use  . Smoking status: Never Smoker  . Smokeless tobacco: Never Used  Substance and  Sexual Activity  . Alcohol use: No  . Drug use: No  . Sexual activity: None  Other Topics Concern  . None  Social History Narrative   Originally from [place]. [place]ame [place] the [place].   Lives with her husband and their 2 children.   Their daughter was born in [place], son in the [place].    No family history on file.   ROS Review of Systems See HPI Constitution: No fevers or chills No malaise No diaphoresis Skin: No rash or itching Eyes: no blurry vision, no double vision GU: no dysuria or hematuria Neuro: no dizziness or headaches * all others reviewed and negative   Objective: Vitals:   04/10/17 1115  BP: 110/70  Pulse: (!) 103  Resp: 16  Temp: 98.2 F (36.8 C)  TempSrc: Oral  SpO2: 99%  Weight: 118 lb (53.5 kg)  Height: 5\' 2"  (1.575 m)    Physical Exam General: alert, oriented, in NAD Head: normocephalic, atraumatic, bilateral frontal sinus tenderness Eyes: EOM intact, no scleral icterus or conjunctival injection Ears: TM clear bilaterally Nose: mucosa +erythematous, +edematous Throat: no pharyngeal exudate or erythema Lymph: no posterior auricular, submental or cervical lymph adenopathy Heart: normal rate, normal sinus rhythm, no murmurs Lungs: clear to auscultation bilaterally, no wheezing   Assessment and Plan Adrianah was seen today for influenza.  Diagnoses and all orders for this visit:  Flu-like symptoms -     POC Influenza A&B(BINAX/QUICKVUE)  Acute bacterial sinusitis  Other orders -     guaiFENesin (MUCINEX) 600 MG 12 hr tablet; Take 1 tablet (600 mg total) by mouth 2 (two) times daily. -     fluticasone (FLONASE) 50 MCG/ACT nasal spray; Place 2 sprays into both nostrils daily. -     amoxicillin-clavulanate (AUGMENTIN) 875-125 MG tablet; Take 1 tablet by mouth 2 (two) times daily. -     fluconazole (DIFLUCAN) 150 MG tablet; Take 1 tablet (150 mg total) by mouth once for 1 dose. Repeat second dose in 3 days   Flu negative Discussed sinusitis  treatment  Discussed common side effects of antibiotic and included handout Included symptomatic treatment with flonase, mucinex  Advised diflucan if yeast vaginitis occurs after completing abx Talbert Trembath A Creta Levin

## 2017-04-14 ENCOUNTER — Telehealth: Payer: Self-pay | Admitting: Family Medicine

## 2017-04-14 NOTE — Telephone Encounter (Signed)
Husband, Warden Fillersaul Bourke called in questioning how long his wife needed to take the Augmentin.  I let him know she needs to continue taking it until it is all gone.   Take it twice a day.  He also clarified the instructions for the Mucinex, Flonase, and Diflucan too.   His wife has taken the Diflucan as directed.    He thanked me for answering his questions.

## 2017-04-14 NOTE — Telephone Encounter (Signed)
Copied from CRM [phone]. Topic: Quick Communication - See Telephone Encounter >> Apr 14, 2017 11:25 AM Eston Mouldavis, Keiosha Cancro B wrote: CRM for notification. See Telephone encounter for:  Pt needs clarification on directions for taking  amoxicillin-clavulanate (AUGMENTIN) 875-125 MG tablet  04/14/17.

## 2017-05-12 ENCOUNTER — Ambulatory Visit: Payer: BLUE CROSS/BLUE SHIELD | Admitting: Physician Assistant

## 2017-05-12 VITALS — BP 112/62 | HR 95 | Temp 98.2°F | Resp 16 | Ht 62.0 in | Wt 116.0 lb

## 2017-05-12 DIAGNOSIS — R059 Cough, unspecified: Secondary | ICD-10-CM

## 2017-05-12 DIAGNOSIS — R05 Cough: Secondary | ICD-10-CM

## 2017-05-12 DIAGNOSIS — R0989 Other specified symptoms and signs involving the circulatory and respiratory systems: Secondary | ICD-10-CM | POA: Diagnosis not present

## 2017-05-12 MED ORDER — PREDNISONE 20 MG PO TABS: ORAL_TABLET | ORAL | 0 refills | Status: AC

## 2017-05-12 MED ORDER — IPRATROPIUM BROMIDE 0.02 % IN SOLN
0.5000 mg | Freq: Once | RESPIRATORY_TRACT | Status: AC
  Administered 2017-05-12: 0.5 mg via RESPIRATORY_TRACT

## 2017-05-12 MED ORDER — ALBUTEROL SULFATE (2.5 MG/3ML) 0.083% IN NEBU
2.5000 mg | INHALATION_SOLUTION | Freq: Once | RESPIRATORY_TRACT | Status: AC
  Administered 2017-05-12: 2.5 mg via RESPIRATORY_TRACT

## 2017-05-12 NOTE — Patient Instructions (Addendum)
Please make sure that you are taking Zyrtec daily and not missing doses.  I would also suggest that you continue taking the Flonase without missing doses.    IF you received an x-ray today, you will receive an invoice from [facility]. Please contact [facility] at [phone] with questions or concerns regarding your invoice.   IF you received labwork today, you will receive an invoice from [facility]. Please contact [facility] at [phone] with questions or concerns regarding your invoice.   Our billing staff will not be able to assist you with questions regarding bills from these companies.  You will be contacted with the lab results as soon as they are available. The fastest way to get your results is to activate your My Chart account. Instructions are located on the last page of this paperwork. If you have not heard from us regarding the results in 2 weeks, please contact this office.

## 2017-05-12 NOTE — Progress Notes (Signed)
05/12/2017 2:34 PM   DOB: Mar 18, 1985 / MRN: [id number]  SUBJECTIVE:  Mia Pierce is a 32 y.o. female presenting for cough, nasal congestion, sore throat, sneezing all of which started about 2 weeks ago and now worsening.  Patient has stopped taking Augmentin about 2 weeks ago for supposed acute bacterial rhinosinusitis.  She denies fever, chest pain, shortness of breath, leg swelling.  She has No Known Allergies.   She  has a past medical history of Abnormal maternal serum screening test (02/14/2015), High risk pregnancy with low PAPPA (02/14/2015), and SVD (spontaneous vaginal delivery) (07/13/2015).    She  reports that she has never smoked. She has never used smokeless tobacco. She reports that she does not drink alcohol or use drugs. She  has no sexual activity history on file. The patient  has no past surgical history on file.  Her family history is not on file.  ROS per HPI  The problem list and medications were reviewed and updated by myself where necessary and exist elsewhere in the encounter.   OBJECTIVE:  BP 112/62   Pulse 95   Temp 98.2 F (36.8 C) (Oral)   Resp 16   Ht 5\' 2"  (1.575 m)   Wt 116 lb (52.6 kg)   LMP 05/08/2017   SpO2 99%   PF 325 L/min   Breastfeeding? No   BMI 21.22 kg/m   Wt Readings from Last 3 Encounters:  05/12/17 116 lb (52.6 kg)  04/10/17 118 lb (53.5 kg)  03/06/17 121 lb (54.9 kg)   Temp Readings from Last 3 Encounters:  05/12/17 98.2 F (36.8 C) (Oral)  04/10/17 98.2 F (36.8 C) (Oral)  03/06/17 97.6 F (36.4 C) (Oral)   BP Readings from Last 3 Encounters:  05/12/17 112/62  04/10/17 110/70  03/06/17 108/86   Pulse Readings from Last 3 Encounters:  05/12/17 95  04/10/17 (!) 103  03/06/17 84     Physical Exam  Constitutional: She is active.  Non-toxic appearance.  HENT:  Right Ear: Hearing, tympanic membrane, external ear and ear canal normal.  Left Ear: Hearing, tympanic membrane, external ear and ear canal normal.  Nose: Nose  normal. Right sinus exhibits no maxillary sinus tenderness and no frontal sinus tenderness. Left sinus exhibits no maxillary sinus tenderness and no frontal sinus tenderness.  Mouth/Throat: Uvula is midline, oropharynx is clear and moist and mucous membranes are normal. Mucous membranes are not dry. No oropharyngeal exudate, posterior oropharyngeal edema or tonsillar abscesses.  Cardiovascular: Normal rate, regular rhythm, S1 normal, S2 normal, normal heart sounds and intact distal pulses. Exam reveals no gallop, no friction rub and no decreased pulses.  No murmur heard. Pulmonary/Chest: Effort normal. No stridor. No tachypnea. No respiratory distress. She has no wheezes. She has no rales.  Abdominal: She exhibits no distension.  Musculoskeletal: She exhibits no edema.  Lymphadenopathy:       Head (right side): No submandibular and no tonsillar adenopathy present.       Head (left side): No submandibular and no tonsillar adenopathy present.    She has no cervical adenopathy.  Neurological: She is alert.  Skin: Skin is warm and dry. She is not diaphoretic. No pallor.    No results found for this or any previous visit (from the past 72 hour(s)).  No results found.  ASSESSMENT AND PLAN:  Mayley was seen today for cough, sore throat and headache.  Diagnoses and all orders for this visit:  Abnormally low peak expiratory flow rate  her exam is completely normal aside from a low peak flow which is most likely secondary to poor effort.  The nebulizer made no change in the peak flow.  Despite this I think she would benefit from steroids as her symptoms are obviously allergic in nature.  I am starting her on a prednisone taper and we will see her back in 12 days if she is having any problems after that. -     albuterol (PROVENTIL) (2.5 MG/3ML) 0.083% nebulizer solution 2.5 mg -     ipratropium (ATROVENT) nebulizer solution 0.5 mg  Cough    The patient is advised to call or return to clinic if she  does not see an improvement in symptoms, or to seek the care of the closest emergency department if she worsens with the above plan.   Deliah BostonMichael Daytona Hedman, MHS, PA-C Primary Care at [facility] 05/12/2017 2:34 PM

## 2017-05-16 ENCOUNTER — Ambulatory Visit: Payer: BLUE CROSS/BLUE SHIELD | Admitting: Physician Assistant

## 2017-05-16 ENCOUNTER — Ambulatory Visit: Payer: Self-pay | Admitting: *Deleted

## 2017-05-16 ENCOUNTER — Encounter: Payer: Self-pay | Admitting: Physician Assistant

## 2017-05-16 ENCOUNTER — Other Ambulatory Visit: Payer: Self-pay

## 2017-05-16 VITALS — BP 104/66 | HR 96 | Temp 98.3°F | Resp 18 | Ht 61.26 in | Wt 116.8 lb

## 2017-05-16 DIAGNOSIS — J019 Acute sinusitis, unspecified: Secondary | ICD-10-CM | POA: Diagnosis not present

## 2017-05-16 DIAGNOSIS — R059 Cough, unspecified: Secondary | ICD-10-CM

## 2017-05-16 DIAGNOSIS — R05 Cough: Secondary | ICD-10-CM

## 2017-05-16 DIAGNOSIS — J029 Acute pharyngitis, unspecified: Secondary | ICD-10-CM

## 2017-05-16 DIAGNOSIS — B9689 Other specified bacterial agents as the cause of diseases classified elsewhere: Secondary | ICD-10-CM

## 2017-05-16 LAB — POCT CBC
Granulocyte percent: 78.9 %G (ref 37–80)
HEMATOCRIT: 37.6 % — AB (ref 37.7–47.9)
Hemoglobin: 12.4 g/dL (ref 12.2–16.2)
LYMPH, POC: 1.4 (ref 0.6–3.4)
MCH, POC: 25.3 pg — AB (ref 27–31.2)
MCHC: 32.9 g/dL (ref 31.8–35.4)
MCV: 76.9 fL — AB (ref 80–97)
MID (cbc): 0.4 (ref 0–0.9)
MPV: 7.2 fL (ref 0–99.8)
POC GRANULOCYTE: 6.8 (ref 2–6.9)
POC LYMPH PERCENT: 16.1 %L (ref 10–50)
POC MID %: 5 % (ref 0–12)
Platelet Count, POC: 288 10*3/uL (ref 142–424)
RBC: 4.89 M/uL (ref 4.04–5.48)
RDW, POC: 14.9 %
WBC: 8.6 10*3/uL (ref 4.6–10.2)

## 2017-05-16 MED ORDER — AZELASTINE HCL 0.1 % NA SOLN: 2.0000 | Freq: Two times a day (BID) | NASAL | 0 refills | Status: DC

## 2017-05-16 MED ORDER — MOXIFLOXACIN HCL 400 MG PO TABS: 400.0000 mg | ORAL_TABLET | Freq: Every day | ORAL | 0 refills | Status: DC

## 2017-05-16 MED ORDER — BENZONATATE 100 MG PO CAPS: 100.0000 mg | ORAL_CAPSULE | Freq: Three times a day (TID) | ORAL | 0 refills | Status: DC | PRN

## 2017-05-16 MED ORDER — GUAIFENESIN ER 1200 MG PO TB12: 1.0000 | ORAL_TABLET | Freq: Two times a day (BID) | ORAL | 1 refills | Status: DC | PRN

## 2017-05-16 NOTE — Patient Instructions (Addendum)
1. Complete the prednisone as prescribed. 2. CONTINUE the Flonase, 2 sprays in each nostril every day. 3. ADD the new medications -take the antibiotic (Avelox/moxifloxacin) until it is all gone, 10 days -when the nasal congestion is resolved, you can stop the azelastine and Mucinex -when the cough is gone, you can stop the Tessalon (benzonatate)    IF you received an x-ray today, you will receive an invoice from [facility]. Please contact [facility] at [phone] with questions or concerns regarding your invoice.   IF you received labwork today, you will receive an invoice from [facility]. Please contact [facility] at [phone] with questions or concerns regarding your invoice.   Our billing staff will not be able to assist you with questions regarding bills from these companies.  You will be contacted with the lab results as soon as they are available. The fastest way to get your results is to activate your My Chart account. Instructions are located on the last page of this paperwork. If you have not heard from us regarding the results in 2 weeks, please contact this office.

## 2017-05-16 NOTE — Telephone Encounter (Signed)
Pt's husband called stating that the patient is feeling worst than on Monday when she was seen in the office for cold, congestion an upper resp issues. She has been taking prednisone since Monday. She now has a more severe sore throat. She also has cough and body aches. Home care advice given to husband for patient with verbal understanding.  Appointment  made per protocol.  He was told to call back if she starts feeling worst or start running a fever. Verbal understanding per husband. Will route to office.   Reason for Disposition . SEVERE (e.g., excruciating) throat pain  Answer Assessment - Initial Assessment Questions 1. ONSET: "When did the throat start hurting?" (Hours or days ago)      2 weeks ago 2. SEVERITY: "How bad is the sore throat?" (Scale 1-10; mild, moderate or severe)   - MILD (1-3):  doesn't interfere with eating or normal activities   - MODERATE (4-7): interferes with eating some solids and normal activities   - SEVERE (8-10):  excruciating pain, interferes with most normal activities   - SEVERE DYSPHAGIA: can't swallow liquids, drooling     severe 3. STREP EXPOSURE: "Has there been any exposure to strep within the past week?" If so, ask: "What type of contact occurred?"      Not sure 4.  VIRAL SYMPTOMS: "Are there any symptoms of a cold, such as a runny nose, cough, hoarse voice or red eyes?"      Runny nose, cough, hoarse voice 5. FEVER: "Do you have a fever?" If so, ask: "What is your temperature, how was it measured, and when did it start?"     Not sure 6. PUS ON THE TONSILS: "Is there pus on the tonsils in the back of your throat?"     no 7. OTHER SYMPTOMS: "Do you have any other symptoms?" (e.g., difficulty breathing, headache, rash)     headache 8. PREGNANCY: "Is there any chance you are pregnant?" "When was your last menstrual period?"     No, LMP April 4th and 5th  Protocols used: SORE THROAT-A-AH

## 2017-05-16 NOTE — Progress Notes (Signed)
Patient ID: Mia Pierce, female    DOB: Jun 29, 1985, 32 y.o.   MRN: [id number]  PCP: Patient, No Pcp Per  Chief Complaint  Patient presents with  . Cough    X 3 weeks  . Sore Throat    X 3 weeks    Subjective:   Presents for evaluation of 3 weeks of cough and sore throat. She is accompanied by her husband, who helps with translation.  Treated for influenza with Xofluza 03/07/2017 at an outside facility.  She presented here to one of my colleagues on 04/10/3017 with 3 days of coughing, body aches, HA, sore throat. Rapid flu test was NEGATIVE. She was diagnosied with acute sinusitis and treated with Augmentin and supportive care with guaifenesin and Flonase. She reports that her symptoms resolved entirely before the current episode began.  She returned 4/08 with cough, nasal congestion, sore throat, sneezing x 2 weeks. Her peak flow was low for height/age and symptoms were thought to be allergic Nebulized treatment in the office with albuterol and atrovent did not improve her symptoms nor peak flow. She was prescribed oral steroid taper, but reports that her throat hurts more and the cough is worse than before starting the prednisone. She also feels more tired.  Nausea, with vomiting x 1 yesterday. No diarrhea. Felt a little dizzy yesterday, a little lightheaded today. No urinary changes. No diarrhea or constipation. No fever, chills.   Review of Systems As above    There are no active problems to display for this patient.    Prior to Admission medications   Medication Sig Start Date End Date Taking? Authorizing Provider  fluticasone (FLONASE) 50 MCG/ACT nasal spray Place 2 sprays into both nostrils daily. 04/10/17  Yes Doristine Bosworth, MD  predniSONE (DELTASONE) 20 MG tablet Take 3 in the morning for 3 days, then 2 in the morning for 3 days, and then 1 in the morning for 3 days. Patient not taking: Reported on 05/16/2017 05/12/17 05/21/17 Yes Ofilia Neas, PA-C     No Known  Allergies     Objective:  Physical Exam  Constitutional: She is oriented to person, place, and time. She appears well-developed and well-nourished. She is active and cooperative. No distress.  BP 104/66 (BP Location: Left Arm, Patient Position: Sitting, Cuff Size: Normal)   Pulse 96   Temp 98.3 F (36.8 C) (Oral)   Resp 18   Ht 5' 1.26" (1.556 m)   Wt 116 lb 12.8 oz (53 kg)   LMP 05/08/2017 (Approximate)   SpO2 98%   Breastfeeding? No   BMI 21.88 kg/m   HENT:  Head: Normocephalic and atraumatic.  Right Ear: Hearing, tympanic membrane and ear canal normal.  Left Ear: Hearing, tympanic membrane and ear canal normal.  Mouth/Throat: Uvula is midline, oropharynx is clear and moist and mucous membranes are normal. No uvula swelling. No oropharyngeal exudate, posterior oropharyngeal edema or posterior oropharyngeal erythema.  Eyes: Pupils are equal, round, and reactive to light. Conjunctivae and EOM are normal. No scleral icterus.  Neck: Normal range of motion. Neck supple. No thyromegaly present.  Cardiovascular: Normal rate, regular rhythm and normal heart sounds.  Pulses:      Radial pulses are 2+ on the right side, and 2+ on the left side.  Pulmonary/Chest: Effort normal and breath sounds normal.  Abdominal: Soft. Bowel sounds are normal. There is no tenderness.  Lymphadenopathy:       Head (right side): No tonsillar, no preauricular, no  posterior auricular and no occipital adenopathy present.       Head (left side): No tonsillar, no preauricular, no posterior auricular and no occipital adenopathy present.    She has no cervical adenopathy.       Right: No supraclavicular adenopathy present.       Left: No supraclavicular adenopathy present.  Neurological: She is alert and oriented to person, place, and time. No sensory deficit.  Skin: Skin is warm, dry and intact. No rash noted. No cyanosis or erythema. Nails show no clubbing.  Psychiatric: She has a normal mood and affect. Her  speech is normal and behavior is normal.       Results for orders placed or performed in visit on 05/16/17  POCT CBC  Result Value Ref Range   WBC 8.6 4.6 - 10.2 K/uL   Lymph, poc 1.4 0.6 - 3.4   POC LYMPH PERCENT 16.1 10 - 50 %L   MID (cbc) 0.4 0 - 0.9   POC MID % 5.0 0 - 12 %M   POC Granulocyte 6.8 2 - 6.9   Granulocyte percent 78.9 37 - 80 %G   RBC 4.89 4.04 - 5.48 M/uL   Hemoglobin 12.4 12.2 - 16.2 g/dL   HCT, POC 16.137.6 (A) 09.637.7 - 47.9 %   MCV 76.9 (A) 80 - 97 fL   MCH, POC 25.3 (A) 27 - 31.2 pg   MCHC 32.9 31.8 - 35.4 g/dL   RDW, POC 04.514.9 %   Platelet Count, POC 288 142 - 424 K/uL   MPV 7.2 0 - 99.8 fL       Assessment & Plan:   1. Cough Likely due to post-nasal drainage.  - benzonatate (TESSALON) 100 MG capsule; Take 1-2 capsules (100-200 mg total) by mouth 3 (three) times daily as needed for cough.  Dispense: 40 capsule; Refill: 0  2. Sore throat Possibly due to drainage from the sinuses.  - POCT CBC - Comprehensive metabolic panel - Epstein-Barr virus VCA antibody panel  3. Acute bacterial sinusitis Given lack of improvement with oral steroids, cover with broad spectrum abx (she was on Augmentin 8 weeks ago for same). If no improvement, would consider CT for evaluation of sinuses and/or referral to ENT. - moxifloxacin (AVELOX) 400 MG tablet; Take 1 tablet (400 mg total) by mouth daily.  Dispense: 10 tablet; Refill: 0 - azelastine (ASTELIN) 0.1 % nasal spray; Place 2 sprays into both nostrils 2 (two) times daily. Use in each nostril as directed  Dispense: 30 mL; Refill: 0 - Guaifenesin (MUCINEX MAXIMUM STRENGTH) 1200 MG TB12; Take 1 tablet (1,200 mg total) by mouth every 12 (twelve) hours as needed.  Dispense: 14 tablet; Refill: 1    Return if symptoms worsen or fail to improve in 7-10 days.   Fernande Brashelle S. Teja Costen, PA-C Primary Care at [facility]

## 2017-05-17 LAB — COMPREHENSIVE METABOLIC PANEL
A/G RATIO: 1.5 (ref 1.2–2.2)
ALBUMIN: 4.2 g/dL (ref 3.5–5.5)
ALT: 12 IU/L (ref 0–32)
AST: 12 IU/L (ref 0–40)
Alkaline Phosphatase: 44 IU/L (ref 39–117)
BUN / CREAT RATIO: 13 (ref 9–23)
BUN: 9 mg/dL (ref 6–20)
Bilirubin Total: <0.2 mg/dL (ref 0.0–1.2)
CO2: 26 mmol/L (ref 20–29)
Calcium: 8.7 mg/dL (ref 8.7–10.2)
Chloride: 104 mmol/L (ref 96–106)
Creatinine, Ser: 0.69 mg/dL (ref 0.57–1.00)
GFR calc non Af Amer: 116 mL/min/{1.73_m2} (ref >59)
GFR, EST AFRICAN AMERICAN: 133 mL/min/{1.73_m2} (ref >59)
GLOBULIN, TOTAL: 2.8 g/dL (ref 1.5–4.5)
Glucose: 112 mg/dL — ABNORMAL HIGH (ref 65–99)
POTASSIUM: 3.8 mmol/L (ref 3.5–5.2)
SODIUM: 142 mmol/L (ref 134–144)
TOTAL PROTEIN: 7 g/dL (ref 6.0–8.5)

## 2017-05-17 LAB — EPSTEIN-BARR VIRUS VCA ANTIBODY PANEL
EBV Early Antigen Ab, IgG: <9 U/mL (ref 0.0–8.9)
EBV NA IgG: >600 U/mL — ABNORMAL HIGH (ref 0.0–17.9)
EBV VCA IgG: 50.7 U/mL — ABNORMAL HIGH (ref 0.0–17.9)
EBV VCA IgM: <36 U/mL (ref 0.0–35.9)

## 2017-06-12 ENCOUNTER — Other Ambulatory Visit: Payer: Self-pay | Admitting: Physician Assistant

## 2017-06-12 DIAGNOSIS — B9689 Other specified bacterial agents as the cause of diseases classified elsewhere: Secondary | ICD-10-CM

## 2017-06-12 DIAGNOSIS — J019 Acute sinusitis, unspecified: Principal | ICD-10-CM

## 2017-06-12 NOTE — Telephone Encounter (Signed)
Astelin 0.1 % nasal spray  LOV 05/16/17 with Chelle Jeffery   No refills ordered.  [facility] [address], [place] - [address] [facility].

## 2017-06-26 ENCOUNTER — Encounter: Payer: Self-pay | Admitting: Physician Assistant

## 2017-06-26 ENCOUNTER — Other Ambulatory Visit: Payer: Self-pay

## 2017-06-26 ENCOUNTER — Ambulatory Visit: Payer: BLUE CROSS/BLUE SHIELD | Admitting: Physician Assistant

## 2017-06-26 VITALS — BP 114/60 | HR 93 | Temp 97.5°F | Resp 16 | Ht 61.54 in | Wt 116.0 lb

## 2017-06-26 DIAGNOSIS — K5900 Constipation, unspecified: Secondary | ICD-10-CM | POA: Diagnosis not present

## 2017-06-26 DIAGNOSIS — K648 Other hemorrhoids: Secondary | ICD-10-CM | POA: Diagnosis not present

## 2017-06-26 MED ORDER — HYDROCORTISONE ACETATE 25 MG RE SUPP: 25.0000 mg | Freq: Two times a day (BID) | RECTAL | 0 refills | Status: DC

## 2017-06-26 NOTE — Patient Instructions (Addendum)
To help reduce constipation and promote bowel health, 1. Drink at least 64 ounces of water each day; 2. Eat plenty of fiber (fruits, vegetables, whole grains, legumes) 3. Get plenty of physical activity  If needed, use a stool softener (docusate) or an osmotic laxative (like Miralax) each day, or as needed.     IF you received an x-ray today, you will receive an invoice from [facility]. Please contact [facility] at [phone] with questions or concerns regarding your invoice.   IF you received labwork today, you will receive an invoice from [place] [facility]. Please contact [facility] at [phone] with questions or concerns regarding your invoice.   Our billing staff will not be able to assist you with questions regarding bills from these companies.  You will be contacted with the lab results as soon as they are available. The fastest way to get your results is to activate your My Chart account. Instructions are located on the last page of this paperwork. If you have not heard from [place] regarding the results in 2 weeks, please contact this office.      Hemorrhoids Hemorrhoids are swollen veins in and around the rectum or anus. Hemorrhoids can cause pain, itching, or bleeding. Most of the time, they do not cause serious problems. They usually get better with diet changes, lifestyle changes, and other home treatments. Follow these instructions at home: Eating and drinking  Eat foods that have fiber, such as whole grains, beans, nuts, fruits, and vegetables. Ask your doctor about taking products that have added fiber (fibersupplements).  Drink enough fluid to keep your pee (urine) clear or pale yellow. For Pain and Swelling  Take a warm-water bath (sitz bath) for 20 minutes to ease pain. Do this 3-4 times a day.  If directed, put ice on the painful area. It may be helpful to use ice between your warm baths. ? Put ice in a plastic bag. ? Place a towel between your skin and  the bag. ? Leave the ice on for 20 minutes, 2-3 times a day. General instructions  Take over-the-counter and prescription medicines only as told by your doctor. ? Medicated creams and medicines that are inserted into the anus (suppositories) may be used or applied as told.  Exercise often.  Go to the bathroom when you have the urge to poop (to have a bowel movement). Do not wait.  Avoid pushing too hard (straining) when you poop.  Keep the butt area dry and clean. Use wet toilet paper or moist paper towels.  Do not sit on the toilet for a long time. Contact a doctor if:  You have any of these: ? Pain and swelling that do not get better with treatment or medicine. ? Bleeding that will not stop. ? Trouble pooping or you cannot poop. ? Pain or swelling outside the area of the hemorrhoids. This information is not intended to replace advice given to you by your health care provider. Make sure you discuss any questions you have with your health care provider. Document Released: 10/31/2007 Document Revised: 06/29/2015 Document Reviewed: 10/05/2014 Elsevier Interactive Patient Education  2018 [employer].  Hemorrhoids Hemorrhoids are swollen veins in and around the rectum or anus. Hemorrhoids can cause pain, itching, or bleeding. Most of the time, they do not cause serious problems. They usually get better with diet changes, lifestyle changes, and other home treatments. Follow these instructions at home: Eating and drinking  Eat foods that have fiber, such as whole grains, beans, nuts, fruits,  and vegetables. Ask your doctor about taking products that have added fiber (fibersupplements).  Drink enough fluid to keep your pee (urine) clear or pale yellow. For Pain and Swelling  Take a warm-water bath (sitz bath) for 20 minutes to ease pain. Do this 3-4 times a day.  If directed, put ice on the painful area. It may be helpful to use ice between your warm baths. ? Put ice in a plastic  bag. ? Place a towel between your skin and the bag. ? Leave the ice on for 20 minutes, 2-3 times a day. General instructions  Take over-the-counter and prescription medicines only as told by your doctor. ? Medicated creams and medicines that are inserted into the anus (suppositories) may be used or applied as told.  Exercise often.  Go to the bathroom when you have the urge to poop (to have a bowel movement). Do not wait.  Avoid pushing too hard (straining) when you poop.  Keep the butt area dry and clean. Use wet toilet paper or moist paper towels.  Do not sit on the toilet for a long time. Contact a doctor if:  You have any of these: ? Pain and swelling that do not get better with treatment or medicine. ? Bleeding that will not stop. ? Trouble pooping or you cannot poop. ? Pain or swelling outside the area of the hemorrhoids. This information is not intended to replace advice given to you by your health care provider. Make sure you discuss any questions you have with your health care provider. Document Released: 10/31/2007 Document Revised: 06/29/2015 Document Reviewed: 10/05/2014 Elsevier Interactive Patient Education  [employer].

## 2017-06-26 NOTE — Progress Notes (Signed)
Patient ID: Mia Pierce, female    DOB: Jan 02, 1986, 32 y.o.   MRN: [id number]  PCP: Patient, No Pcp Per  Chief Complaint  Patient presents with  . Rectal Bleeding    started last week and notice this morning     Subjective:   Presents for evaluation of bright red blood per rectum. She is accompanied by her husband, who helps with translation.   I have seen here twice previously, most recently 05/16/2017, with cough.  Last week, she noticed blood with BM x 3, and again yesterday and again this morning. Typically has BM at least one time each day. Usually has to strain. Some pain with defecation and with wiping.  The stool is hard.  Bright red blood, on the outside of the stool and on the paper after wiping. Not sure if there are any lumps or masses at the anus. Sometimes feels the sensation that she is going to have diarrhea, but doesn't.    Review of Systems See above    There are no active problems to display for this patient.    Prior to Admission medications   Not on File      No Known Allergies     Objective:  Physical Exam  Constitutional: She is oriented to person, place, and time. She appears well-developed and well-nourished. She is active and cooperative. No distress.  BP 114/60   Pulse 93   Temp (!) 97.5 F (36.4 C)   Resp 16   Ht 5' 1.54" (1.563 m)   Wt 116 lb (52.6 kg)   SpO2 100%   BMI 21.54 kg/m   HENT:  Head: Normocephalic and atraumatic.  Right Ear: Hearing normal.  Left Ear: Hearing normal.  Eyes: Conjunctivae are normal. No scleral icterus.  Neck: Normal range of motion. Neck supple. No thyromegaly present.  Cardiovascular: Normal rate, regular rhythm and normal heart sounds.  Pulses:      Radial pulses are 2+ on the right side, and 2+ on the left side.  Pulmonary/Chest: Effort normal and breath sounds normal.  Abdominal: Soft. Normal appearance and bowel sounds are normal. She exhibits no mass. There is no hepatosplenomegaly.  There is no tenderness. No hernia.  Genitourinary: Rectal exam shows internal hemorrhoid and tenderness. Rectal exam shows no external hemorrhoid, no fissure, no mass and anal tone normal.  Lymphadenopathy:       Head (right side): No tonsillar, no preauricular, no posterior auricular and no occipital adenopathy present.       Head (left side): No tonsillar, no preauricular, no posterior auricular and no occipital adenopathy present.    She has no cervical adenopathy.       Right: No supraclavicular adenopathy present.       Left: No supraclavicular adenopathy present.  Neurological: She is alert and oriented to person, place, and time. No sensory deficit.  Skin: Skin is warm, dry and intact. No rash noted. No cyanosis or erythema. Nails show no clubbing.  Psychiatric: She has a normal mood and affect. Her speech is normal and behavior is normal.           Assessment & Plan:   1. Constipation, unspecified constipation type 2. Internal hemorrhoid, bleeding Counseled on dietary and lifestyle changes to reduce constipation.  For bleeding and pain, Anusol HC suppositories. - hydrocortisone (ANUSOL-HC) 25 MG suppository; Place 1 suppository (25 mg total) rectally 2 (two) times daily.  Dispense: 12 suppository; Refill: 0    Return if symptoms  worsen or fail to improve.   Fernande Bras, PA-C Primary Care at [facility]

## 2017-07-19 IMAGING — US US MFM OB DETAIL+14 WK
1 series · 14 of 28 positions shown · non-contrast
Comparison: none

[Series 1: us mfm ob detail+14 wk · 91 acquisitions, 14 frames shown]
[im 4/91]
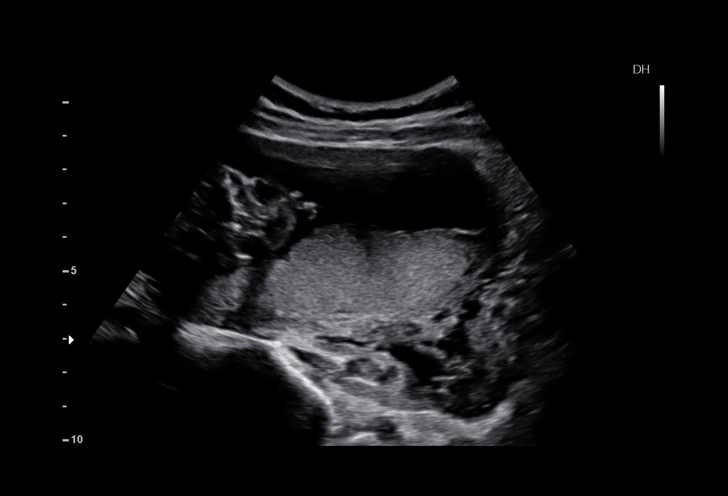
[im 11/91]
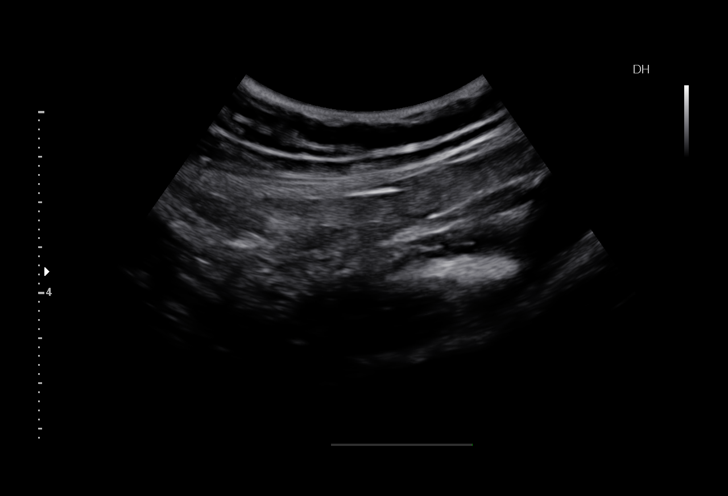
[im 17/91]
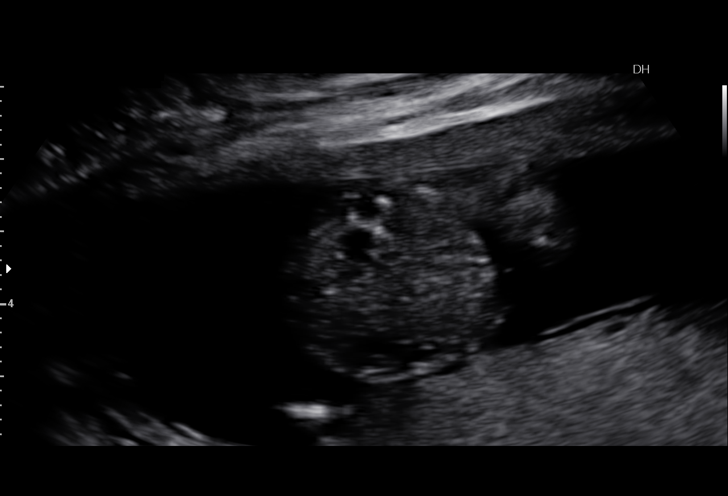
[im 24/91]
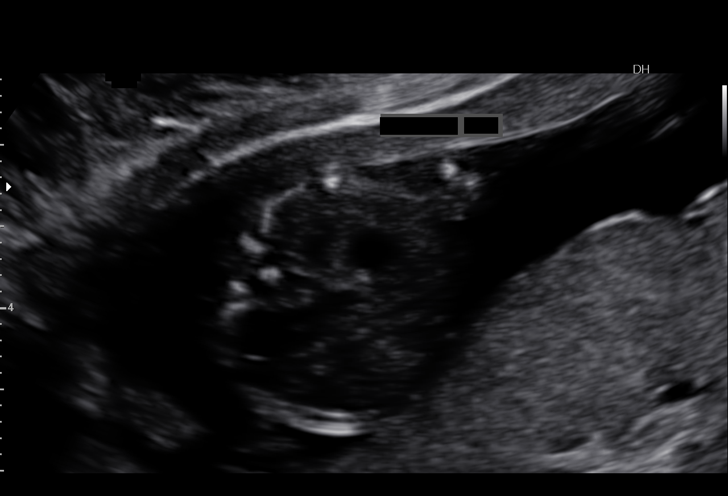
[im 31/91]
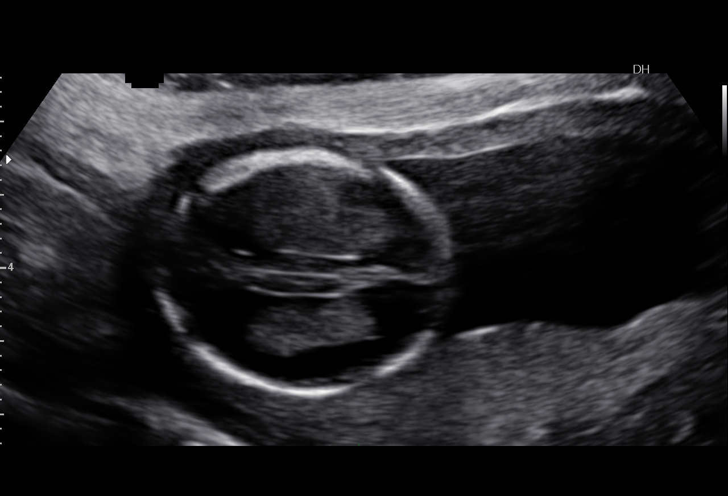
[im 37/91]
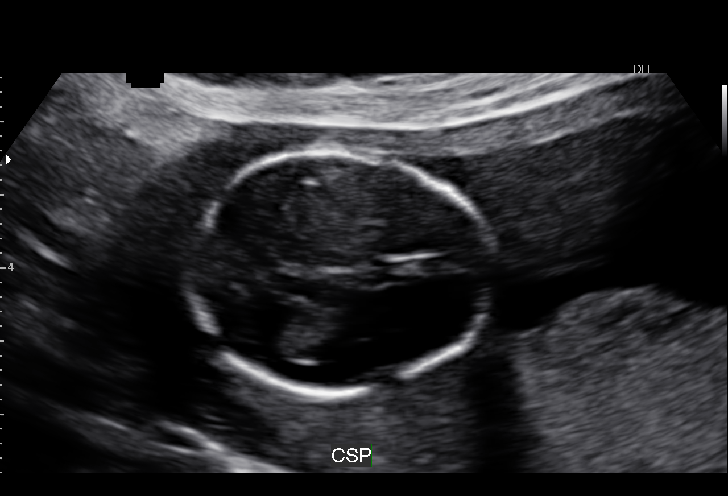
[im 44/91]
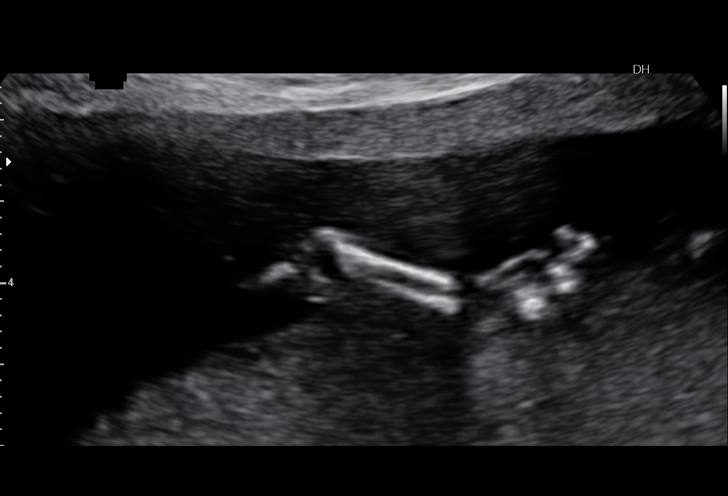
[im 51/91]
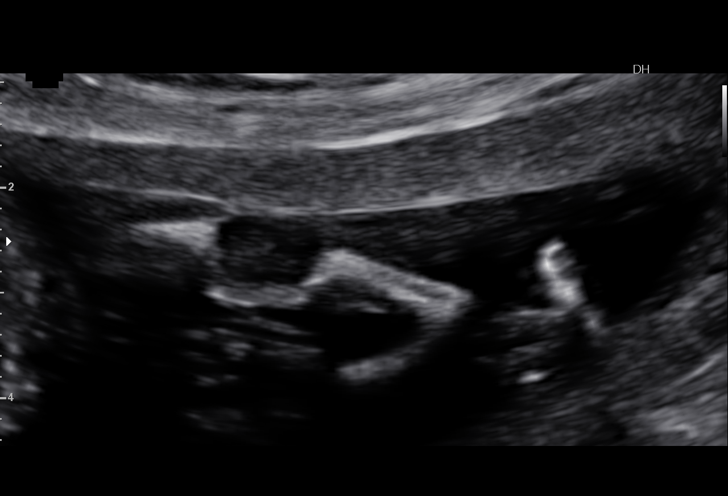
[im 57/91]
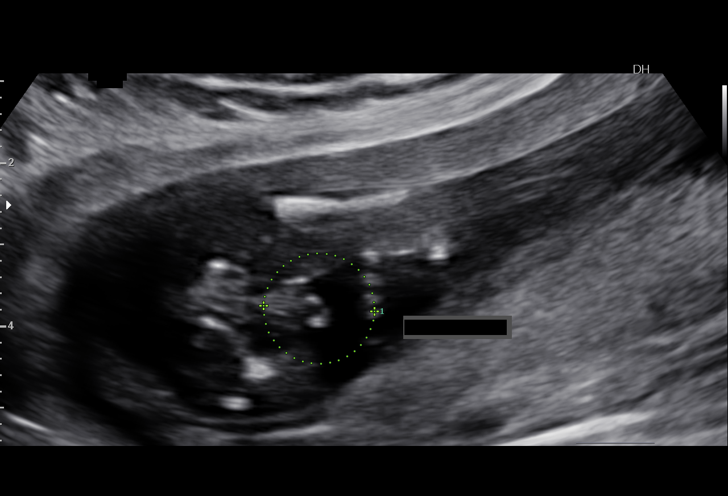
[im 64/91]
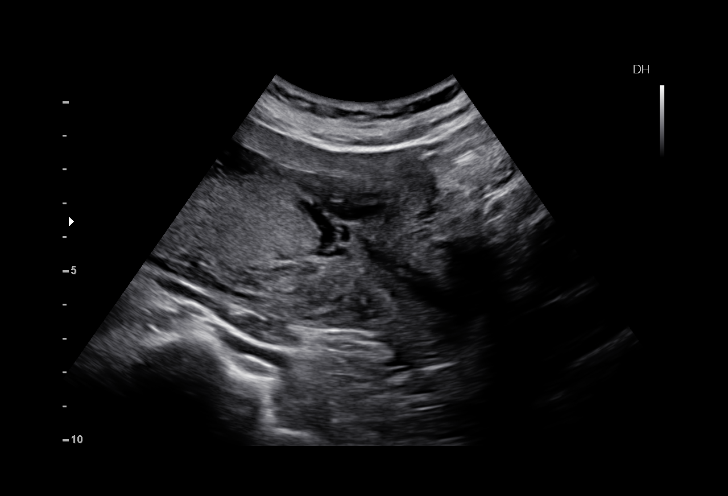
[im 71/91]
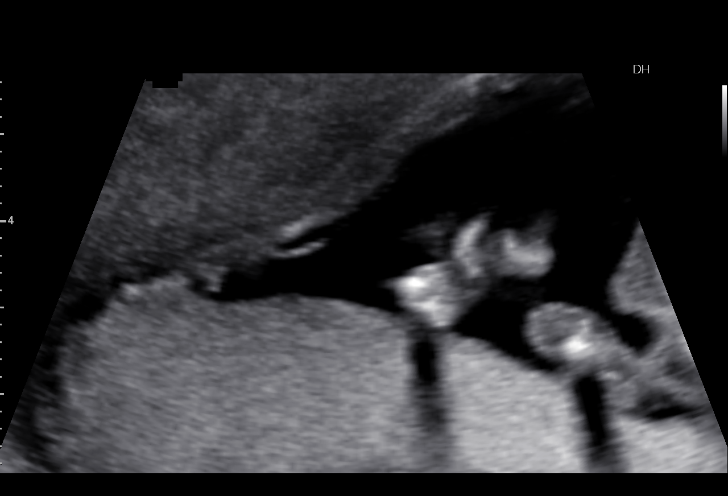
[im 77/91]
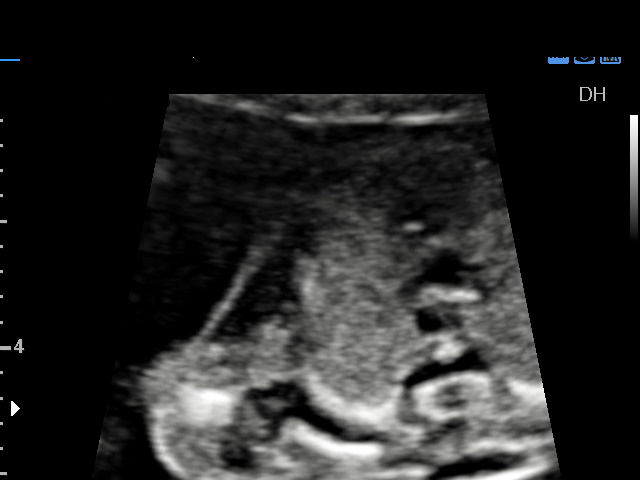
[im 84/91]
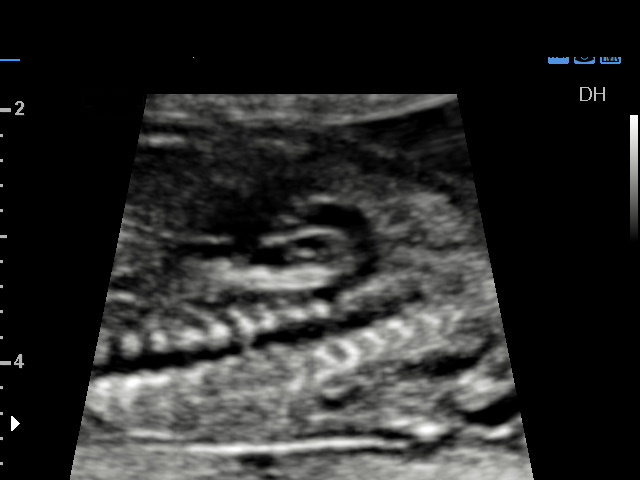
[im 91/91]
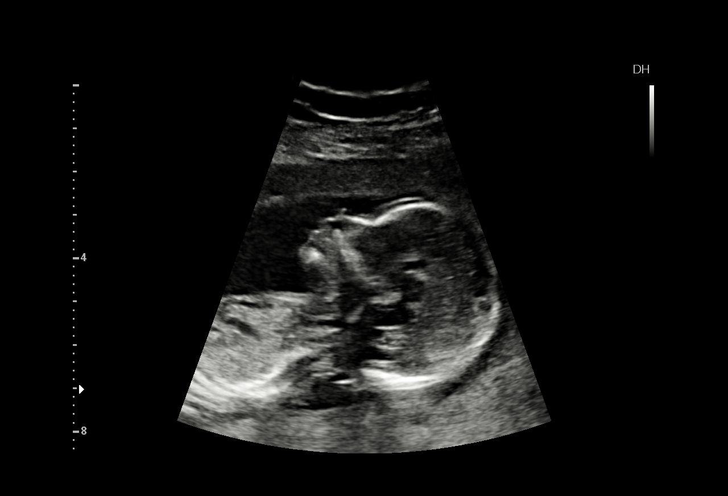

[14 of 28 positions shown; findings below may reference images not displayed]

#101

1  LIEM ENLOE            [id number]      [id number]     [id number]
Indications

16 weeks gestation of pregnancy
Detailed fetal anatomic survey                 Z36
Abnormal first trimester screen (DSR [DATE],
NT 2.0 mm)
Abnormal biochemical finding on antenatal
screening of mother (Youna Bellin
MoM)
OB History

Gravidity:    2         Term:   1        Prem:   0         SAB:   0
TOP:          0       Ectopic:  0        Living: 1
Fetal Evaluation

Num Of Fetuses:     1
Cardiac Activity:   Observed
Presentation:       Transverse, head to maternal right
Placenta:           Posterior Previa
P. Cord Insertion:  Visualized, central

Amniotic Fluid
AFI FV:      Subjectively within normal limits
Biometry

BPD:      32.6  mm     G. Age:  16w 1d                  CI:         72.89  %    70 - 86
FL/HC:       13.3  %    13.3 -
HC:      121.4  mm     G. Age:  16w 0d         39  %    HC/AC:       1.22       1.05 -
AC:       99.5  mm     G. Age:  16w 0d         51  %    FL/BPD:      49.7  %
FL:       16.2  mm     G. Age:  14w 5d          8  %    FL/AC:       16.3  %    20 - 24
HUM:      17.1  mm     G. Age:  14w 6d         17  %
CER:      15.3  mm     G. Age:  15w 2d         34  %
NFT:       2.6  mm
CM:        2.9  mm

Est. FW:     125   gm     0 lb 4 oz     56  %
Gestational Age

LMP:           16w 0d        Date:  10/25/14                 EDD:    08/01/15
U/S Today:     15w 5d                                        EDD:    08/03/15
Best:          16w 0d     Det. By:  LMP  (10/25/14)          EDD:    08/01/15
Anatomy

Cranium:          Appears normal         Aortic Arch:      Appears normal
Fetal Cavum:      Appears normal         Ductal Arch:      Appears normal
Ventricles:       Appears normal         Diaphragm:        Appears normal
Choroid Plexus:   Appears normal         Stomach:          Appears normal, left
sided
Cerebellum:       Appears normal         Abdomen:          Appears normal
Posterior Fossa:  Appears normal         Abdominal Wall:   Appears nml (cord
insert, abd wall)
Nuchal Fold:      Appears normal         Cord Vessels:     Appears normal (3
vessel cord)
Face:             Appears normal         Kidneys:          Appear normal
(orbits and profile)
Lips:             Appears normal         Bladder:          Appears normal
Fetal Thoracic:   Appears normal         Spine:            Appears normal
Heart:            Appears normal         Upper             Appears normal
(4CH, axis, and        Extremities:
situs)
RVOT:             Appears normal         Lower             Appears normal
Extremities:
LVOT:             Appears normal

Other:  Fetus appears to be a male. Heels visualized. Nasal bone visualized.
Cervix Uterus Adnexa

Cervix
Length:            3.1  cm.
Normal appearance by transabdominal scan.
Impression

SIUP at 16+0 weeks
Normal detailed fetal anatomy
Markers of aneuploidy: none
Normal amniotic fluid volume
Measurements consistent with LMP dating
Posterior marginal previa; inferior edge ends at interval os

After genetic counseling, Ms. Rambao opted to have cell free
DNA screening.  We will forward the results to you as soon
as they are available ([facility]).
Recommendations

Even though anatomy visualized, the gestational age was
early for an anatomic survey. Suggest repeat US in 4-6
weeks for anatomy and placental location
Follow-up ultrasound for growth in the third trimester due to
the Youna Bellin level

## 2018-01-14 DIAGNOSIS — J111 Influenza due to unidentified influenza virus with other respiratory manifestations: Secondary | ICD-10-CM | POA: Diagnosis not present

## 2019-06-08 ENCOUNTER — Other Ambulatory Visit: Payer: Self-pay

## 2019-06-08 ENCOUNTER — Telehealth (INDEPENDENT_AMBULATORY_CARE_PROVIDER_SITE_OTHER): Payer: BLUE CROSS/BLUE SHIELD | Admitting: Registered Nurse

## 2019-06-08 ENCOUNTER — Encounter: Payer: Self-pay | Admitting: Registered Nurse

## 2019-06-08 DIAGNOSIS — J302 Other seasonal allergic rhinitis: Secondary | ICD-10-CM

## 2019-06-08 DIAGNOSIS — J029 Acute pharyngitis, unspecified: Secondary | ICD-10-CM

## 2019-06-08 DIAGNOSIS — J3489 Other specified disorders of nose and nasal sinuses: Secondary | ICD-10-CM

## 2019-06-08 MED ORDER — FLUTICASONE PROPIONATE 50 MCG/ACT NA SUSP: 2.0000 | Freq: Every day | NASAL | 6 refills | Status: DC

## 2019-06-08 MED ORDER — AMOXICILLIN-POT CLAVULANATE 875-125 MG PO TABS: 1.0000 | ORAL_TABLET | Freq: Two times a day (BID) | ORAL | 0 refills | Status: DC

## 2019-06-08 MED ORDER — CETIRIZINE HCL 10 MG PO TABS: 10.0000 mg | ORAL_TABLET | Freq: Every day | ORAL | 11 refills | Status: DC

## 2019-06-08 NOTE — Patient Instructions (Signed)
° ° ° °  If you have lab work done today you will be contacted with your lab results within the next 2 weeks.  If you have not heard from us then please contact us. The fastest way to get your results is to register for My Chart. ° ° °IF you received an x-ray today, you will receive an invoice from [facility]. Please contact [facility] at [phone] with questions or concerns regarding your invoice.  ° °IF you received labwork today, you will receive an invoice from [facility]. Please contact [facility] at [phone] with questions or concerns regarding your invoice.  ° °Our billing staff will not be able to assist you with questions regarding bills from these companies. ° °You will be contacted with the lab results as soon as they are available. The fastest way to get your results is to activate your My Chart account. Instructions are located on the last page of this paperwork. If you have not heard from us regarding the results in 2 weeks, please contact this office. °  ° ° ° °

## 2019-07-11 NOTE — Progress Notes (Signed)
Telemedicine Encounter- SOAP NOTE Established Patient  This telephone encounter was conducted with the patient's (or proxy's) verbal consent via audio telecommunications: yes  Patient was instructed to have this encounter in a suitably private space; and to only have persons present to whom they give permission to participate. In addition, patient identity was confirmed by use of name plus two identifiers (DOB and address).  I discussed the limitations, risks, security and privacy concerns of performing an evaluation and management service by telephone and the availability of in person appointments. I also discussed with the patient that there may be a patient responsible charge related to this service. The patient expressed understanding and agreed to proceed.  I spent a total of 11 minutes talking with the patient or their proxy.  Chief Complaint  Patient presents with  . Sore Throat    patient states for one week she has been experiencing body aches, cough, fever, sore throat, and fever. Per patient she has been taking some OTC cold medicine but no real relief. Patient thinks its from her first covid vaccine last week.    Subjective   Mia Pierce is a 34 y.o. established patient. Telephone visit today for sore throat.  HPI Over one week of sore throat, body aches, cough, feverish feeling. No sick contacts.  Has taken otc cold relief without effect.  Had first COVID injection one week ago - symptoms started around that time.  Denies nvd, shob, wheezing, chest pain, palpitations, and sensory changes. Does have a hx of seasonal allergies.  No further concerns at this time.   There are no problems to display for this patient.   Past Medical History:  Diagnosis Date  . Abnormal maternal serum screening test 02/14/2015   1:6 Down syndrome risk from first trimester screen; NIPS drawn 1/10   . High risk pregnancy with low PAPPA 02/14/2015   0.28 MoM   . SVD (spontaneous vaginal  delivery) 07/13/2015    Current Outpatient Medications  Medication Sig Dispense Refill  . amoxicillin-clavulanate (AUGMENTIN) 875-125 MG tablet Take 1 tablet by mouth 2 (two) times daily. 14 tablet 0  . cetirizine (ZYRTEC) 10 MG tablet Take 1 tablet (10 mg total) by mouth daily. 30 tablet 11  . fluticasone (FLONASE) 50 MCG/ACT nasal spray Place 2 sprays into both nostrils daily. 16 g 6  . hydrocortisone (ANUSOL-HC) 25 MG suppository Place 1 suppository (25 mg total) rectally 2 (two) times daily. (Patient not taking: Reported on 06/08/2019) 12 suppository 0   No current facility-administered medications for this visit.    No Known Allergies  Social History   Socioeconomic History  . Marital status: Married    Spouse name: Not on file  . Number of children: 2  . Years of education: Not on file  . Highest education level: Not on file  Occupational History  . Occupation: Advertising account planner  Tobacco Use  . Smoking status: Never Smoker  . Smokeless tobacco: Never Used  Substance and Sexual Activity  . Alcohol use: No  . Drug use: No  . Sexual activity: Yes  Other Topics Concern  . Not on file  Social History Narrative   Originally from [place]. Came to the [place] 2014.   Lives with her husband and their 2 children.   Their daughter was born in [place], son in the [place].   Social Determinants of Health   Financial Resource Strain:   . Difficulty of Paying Living Expenses:   Food Insecurity:   .  Worried About Charity fundraiser in the Last Year:   . Arboriculturist in the Last Year:   Transportation Needs:   . Film/video editor (Medical):   Marland Kitchen Lack of Transportation (Non-Medical):   Physical Activity:   . Days of Exercise per Week:   . Minutes of Exercise per Session:   Stress:   . Feeling of Stress :   Social Connections:   . Frequency of Communication with Friends and Family:   . Frequency of Social Gatherings with Friends and Family:   . Attends Religious Services:   .  Active Member of Clubs or Organizations:   . Attends Archivist Meetings:   Marland Kitchen Marital Status:   Intimate Partner Violence:   . Fear of Current or Ex-Partner:   . Emotionally Abused:   Marland Kitchen Physically Abused:   . Sexually Abused:     Review of Systems  Constitutional: Positive for fever and malaise/fatigue. Negative for chills, diaphoresis and weight loss.  HENT: Positive for congestion and sore throat. Negative for ear discharge, ear pain, hearing loss, nosebleeds, sinus pain and tinnitus.   Eyes: Negative.   Respiratory: Positive for cough and sputum production. Negative for hemoptysis, shortness of breath, wheezing and stridor.   Cardiovascular: Negative.   Gastrointestinal: Negative.   Genitourinary: Negative.   Musculoskeletal: Negative.   Skin: Negative.   Neurological: Negative.   Endo/Heme/Allergies: Negative.   Psychiatric/Behavioral: Negative.   All other systems reviewed and are negative.   Objective   Vitals as reported by the patient: There were no vitals filed for this visit.  Nichol was seen today for sore throat.  Diagnoses and all orders for this visit:  Acute pharyngitis, unspecified etiology -     amoxicillin-clavulanate (AUGMENTIN) 875-125 MG tablet; Take 1 tablet by mouth 2 (two) times daily.  Seasonal allergies -     cetirizine (ZYRTEC) 10 MG tablet; Take 1 tablet (10 mg total) by mouth daily.  Rhinorrhea -     fluticasone (FLONASE) 50 MCG/ACT nasal spray; Place 2 sprays into both nostrils daily.   PLAN  Augmentin  Flonase and cetirizine for symptom and allergy relief  Suggest COVID precautions  Encourage to still get second dose of vaccine  Return precautions reviewed  Patient encouraged to call clinic with any questions, comments, or concerns.  I discussed the assessment and treatment plan with the patient. The patient was provided an opportunity to ask questions and all were answered. The patient agreed with the plan and  demonstrated an understanding of the instructions.   The patient was advised to call back or seek an in-person evaluation if the symptoms worsen or if the condition fails to improve as anticipated.  I provided 11 minutes of non-face-to-face time during this encounter.  Maximiano Coss, NP  Primary Care at [facility]

## 2020-01-27 ENCOUNTER — Telehealth: Payer: Self-pay | Admitting: Family Medicine

## 2020-01-27 ENCOUNTER — Encounter: Payer: Self-pay | Admitting: Family Medicine

## 2020-01-27 ENCOUNTER — Telehealth (INDEPENDENT_AMBULATORY_CARE_PROVIDER_SITE_OTHER): Payer: Self-pay | Admitting: Family Medicine

## 2020-01-27 ENCOUNTER — Other Ambulatory Visit: Payer: Self-pay

## 2020-01-27 DIAGNOSIS — R059 Cough, unspecified: Secondary | ICD-10-CM

## 2020-01-27 MED ORDER — BENZONATATE 100 MG PO CAPS: 100.0000 mg | ORAL_CAPSULE | Freq: Three times a day (TID) | ORAL | 0 refills | Status: DC | PRN

## 2020-01-27 MED ORDER — GUAIFENESIN-CODEINE 100-10 MG/5ML PO SOLN: 5.0000 mL | Freq: Every evening | ORAL | 0 refills | Status: AC | PRN

## 2020-01-27 MED ORDER — MUCINEX DM MAXIMUM STRENGTH 60-1200 MG PO TB12: 1.0000 | ORAL_TABLET | Freq: Two times a day (BID) | ORAL | 0 refills | Status: DC

## 2020-01-27 NOTE — Progress Notes (Signed)
Virtual Visit Note  I connected with patient on 01/27/20 at 1318 by telephone due to unable to work Epic video visit and verified that I am speaking with the correct person using two identifiers. Mia Pierce is currently located at home and no family members are currently with them during visit. The provider, Azalee Course Dhairya Corales, FNP is located in their office at time of visit.  I discussed the limitations, risks, security and privacy concerns of performing an evaluation and management service by telephone and the availability of in person appointments. I also discussed with the patient that there may be a patient responsible charge related to this service. The patient expressed understanding and agreed to proceed.   I provided 20 minutes of non-face-to-face time during this encounter.  Chief Complaint  Patient presents with  . Cough    Pt has had cough since mid last week, dry, pt notes fatigue and some headache with sinus drainage.      HPI ? Symptoms started last wed Most bothersome symptom is the cough Has been taking theraflu Hasn't been helping Declines testing for flu or covid at this time Husband has also been sick, the kids are not sick Have no plans to leave the house during the holidays  No Known Allergies  Prior to Admission medications   Medication Sig Start Date End Date Taking? Authorizing Provider  amoxicillin-clavulanate (AUGMENTIN) 875-125 MG tablet Take 1 tablet by mouth 2 (two) times daily. 06/08/19   Janeece Agee, NP  cetirizine (ZYRTEC) 10 MG tablet Take 1 tablet (10 mg total) by mouth daily. 06/08/19   Janeece Agee, NP  fluticasone (FLONASE) 50 MCG/ACT nasal spray Place 2 sprays into both nostrils daily. 06/08/19   Janeece Agee, NP  hydrocortisone (ANUSOL-HC) 25 MG suppository Place 1 suppository (25 mg total) rectally 2 (two) times daily. Patient not taking: Reported on 06/08/2019 06/26/17   Porfirio Oar, PA    Past Medical History:  Diagnosis Date  .  Abnormal maternal serum screening test 02/14/2015   1:6 Down syndrome risk from first trimester screen; NIPS drawn 1/10   . High risk pregnancy with low PAPPA 02/14/2015   0.28 MoM   . SVD (spontaneous vaginal delivery) 07/13/2015    History reviewed. No pertinent surgical history.  Social History   Tobacco Use  . Smoking status: Never Smoker  . Smokeless tobacco: Never Used  Substance Use Topics  . Alcohol use: No    History reviewed. No pertinent family history.  Review of Systems  Constitutional: Positive for malaise/fatigue. Negative for chills and fever.  HENT: Positive for congestion and sinus pain. Negative for ear pain and sore throat.   Eyes: Negative for pain, discharge and redness.  Respiratory: Positive for cough. Negative for sputum production, shortness of breath and wheezing.   Cardiovascular: Negative for chest pain.  Neurological: Positive for headaches.    Objective  Constitutional:      General: She is not in acute distress.    Appearance: Normal appearance. She is not ill-appearing.   Pulmonary:     Effort: Pulmonary effort is normal. No respiratory distress.  Neurological:     Mental Status: She is alert and oriented to person, place, and time.  Psychiatric:        Mood and Affect: Mood normal.        Behavior: Behavior normal.     ASSESSMENT and PLAN  Problem List Items Addressed This Visit   None   Visit Diagnoses    Cough    -  Primary   Relevant Medications   Dextromethorphan-guaiFENesin (MUCINEX DM MAXIMUM STRENGTH) 60-1200 MG TB12   benzonatate (TESSALON) 100 MG capsule   guaiFENesin-codeine 100-10 MG/5ML syrup  Discussed conservative treatment: Rest, fluids, humidifier,Tylenol and Ibuprofen Continue to treat symptoms RTC/ ED precautions provided R/se/b of medications discussed       Return in about 4 weeks (around 02/24/2020), or if symptoms worsen or fail to improve.    The above assessment and management plan was discussed  with the patient. The patient verbalized understanding of and has agreed to the management plan. Patient is aware to call the clinic if symptoms persist or worsen. Patient is aware when to return to the clinic for a follow-up visit. Patient educated on when it is appropriate to go to the emergency department.     Macario Carls Giara Mcgaughey, FNP-BC Primary Care at [facility] [address] [place], [place] [address] Ph.  [phone] Fax [phone]

## 2020-01-27 NOTE — Patient Instructions (Signed)
° ° ° °  If you have lab work done today you will be contacted with your lab results within the next 2 weeks.  If you have not heard from us then please contact us. The fastest way to get your results is to register for My Chart. ° ° °IF you received an x-ray today, you will receive an invoice from [facility]. Please contact [facility] at [phone] with questions or concerns regarding your invoice.  ° °IF you received labwork today, you will receive an invoice from [facility]. Please contact [facility] at [phone] with questions or concerns regarding your invoice.  ° °Our billing staff will not be able to assist you with questions regarding bills from these companies. ° °You will be contacted with the lab results as soon as they are available. The fastest way to get your results is to activate your My Chart account. Instructions are located on the last page of this paperwork. If you have not heard from us regarding the results in 2 weeks, please contact this office. °  ° ° ° °

## 2021-10-09 DIAGNOSIS — Z3491 Encounter for supervision of normal pregnancy, unspecified, first trimester: Secondary | ICD-10-CM | POA: Diagnosis not present

## 2021-10-09 DIAGNOSIS — Z6824 Body mass index (BMI) 24.0-24.9, adult: Secondary | ICD-10-CM | POA: Diagnosis not present

## 2021-10-09 DIAGNOSIS — Z124 Encounter for screening for malignant neoplasm of cervix: Secondary | ICD-10-CM | POA: Diagnosis not present

## 2021-10-09 DIAGNOSIS — Z113 Encounter for screening for infections with a predominantly sexual mode of transmission: Secondary | ICD-10-CM | POA: Diagnosis not present

## 2021-10-09 DIAGNOSIS — Z3201 Encounter for pregnancy test, result positive: Secondary | ICD-10-CM | POA: Diagnosis not present

## 2021-10-09 DIAGNOSIS — N925 Other specified irregular menstruation: Secondary | ICD-10-CM | POA: Diagnosis not present

## 2021-10-09 LAB — OB RESULTS CONSOLE GC/CHLAMYDIA
Chlamydia: NEGATIVE
Neisseria Gonorrhea: NEGATIVE
Neisseria Gonorrhea: NEGATIVE

## 2021-11-07 DIAGNOSIS — Z369 Encounter for antenatal screening, unspecified: Secondary | ICD-10-CM | POA: Diagnosis not present

## 2021-11-07 DIAGNOSIS — Z3480 Encounter for supervision of other normal pregnancy, unspecified trimester: Secondary | ICD-10-CM | POA: Diagnosis not present

## 2021-11-07 LAB — OB RESULTS CONSOLE RUBELLA ANTIBODY, IGM: Rubella: IMMUNE

## 2021-11-07 LAB — OB RESULTS CONSOLE HIV ANTIBODY (ROUTINE TESTING): HIV: NONREACTIVE

## 2021-11-07 LAB — OB RESULTS CONSOLE HEPATITIS B SURFACE ANTIGEN: Hepatitis B Surface Ag: NEGATIVE

## 2021-12-04 DIAGNOSIS — R300 Dysuria: Secondary | ICD-10-CM | POA: Diagnosis not present

## 2021-12-04 DIAGNOSIS — Z369 Encounter for antenatal screening, unspecified: Secondary | ICD-10-CM | POA: Diagnosis not present

## 2022-01-03 DIAGNOSIS — Z363 Encounter for antenatal screening for malformations: Secondary | ICD-10-CM | POA: Diagnosis not present

## 2022-01-03 DIAGNOSIS — Z3A20 20 weeks gestation of pregnancy: Secondary | ICD-10-CM | POA: Diagnosis not present

## 2022-02-01 DIAGNOSIS — Z369 Encounter for antenatal screening, unspecified: Secondary | ICD-10-CM | POA: Diagnosis not present

## 2022-02-04 NOTE — L&D Delivery Note (Signed)
Delivery Note She had SROM with light meconium, pushing involuntarily.  AROM of forebag also light meconium, then was complete.  She pushed with 2 ctx.  At 3:56 PM a viable female was delivered via Vaginal, Spontaneous (Presentation: Right Occiput Anterior).  APGAR: 9, 9; weight pending.   Placenta status: Spontaneous, Intact.  Cord: 3 vessels with the following complications: Shoulder cord x 1 reduced.   Anesthesia: Local Episiotomy: None Lacerations: 1st degree Suture Repair: 3.0 vicryl rapide Est. Blood Loss (mL):  153  Mom to postpartum.  Baby to Couplet care / Skin to Skin.  Mia Pierce 05/11/2022, 4:22 PM

## 2022-03-08 DIAGNOSIS — R509 Fever, unspecified: Secondary | ICD-10-CM | POA: Diagnosis not present

## 2022-03-08 DIAGNOSIS — R52 Pain, unspecified: Secondary | ICD-10-CM | POA: Diagnosis not present

## 2022-03-08 DIAGNOSIS — Z03818 Encounter for observation for suspected exposure to other biological agents ruled out: Secondary | ICD-10-CM | POA: Diagnosis not present

## 2022-03-08 DIAGNOSIS — J101 Influenza due to other identified influenza virus with other respiratory manifestations: Secondary | ICD-10-CM | POA: Diagnosis not present

## 2022-03-08 DIAGNOSIS — J029 Acute pharyngitis, unspecified: Secondary | ICD-10-CM | POA: Diagnosis not present

## 2022-03-08 DIAGNOSIS — R051 Acute cough: Secondary | ICD-10-CM | POA: Diagnosis not present

## 2022-03-15 DIAGNOSIS — Z3480 Encounter for supervision of other normal pregnancy, unspecified trimester: Secondary | ICD-10-CM | POA: Diagnosis not present

## 2022-03-15 DIAGNOSIS — Z23 Encounter for immunization: Secondary | ICD-10-CM | POA: Diagnosis not present

## 2022-03-29 DIAGNOSIS — Z369 Encounter for antenatal screening, unspecified: Secondary | ICD-10-CM | POA: Diagnosis not present

## 2022-04-05 DIAGNOSIS — O99810 Abnormal glucose complicating pregnancy: Secondary | ICD-10-CM | POA: Diagnosis not present

## 2022-04-12 DIAGNOSIS — Z1330 Encounter for screening examination for mental health and behavioral disorders, unspecified: Secondary | ICD-10-CM | POA: Diagnosis not present

## 2022-04-12 DIAGNOSIS — Z369 Encounter for antenatal screening, unspecified: Secondary | ICD-10-CM | POA: Diagnosis not present

## 2022-04-12 DIAGNOSIS — Z3493 Encounter for supervision of normal pregnancy, unspecified, third trimester: Secondary | ICD-10-CM | POA: Diagnosis not present

## 2022-04-25 DIAGNOSIS — Z369 Encounter for antenatal screening, unspecified: Secondary | ICD-10-CM | POA: Diagnosis not present

## 2022-04-25 DIAGNOSIS — Z3480 Encounter for supervision of other normal pregnancy, unspecified trimester: Secondary | ICD-10-CM | POA: Diagnosis not present

## 2022-05-02 DIAGNOSIS — Z369 Encounter for antenatal screening, unspecified: Secondary | ICD-10-CM | POA: Diagnosis not present

## 2022-05-07 DIAGNOSIS — Z369 Encounter for antenatal screening, unspecified: Secondary | ICD-10-CM | POA: Diagnosis not present

## 2022-05-11 ENCOUNTER — Other Ambulatory Visit: Payer: Self-pay

## 2022-05-11 ENCOUNTER — Encounter (HOSPITAL_COMMUNITY): Payer: Self-pay | Admitting: *Deleted

## 2022-05-11 ENCOUNTER — Inpatient Hospital Stay (HOSPITAL_COMMUNITY)
Admission: AD | Admit: 2022-05-11 | Discharge: 2022-05-13 | DRG: 807 | Disposition: A | Discharge status: Home / Self Care | Payer: BC Managed Care – PPO | Attending: Obstetrics and Gynecology | Admitting: Obstetrics and Gynecology

## 2022-05-11 DIAGNOSIS — Z23 Encounter for immunization: Secondary | ICD-10-CM | POA: Diagnosis not present

## 2022-05-11 DIAGNOSIS — Z3A38 38 weeks gestation of pregnancy: Secondary | ICD-10-CM | POA: Diagnosis not present

## 2022-05-11 DIAGNOSIS — O26893 Other specified pregnancy related conditions, third trimester: Secondary | ICD-10-CM | POA: Diagnosis not present

## 2022-05-11 LAB — CBC
HCT: 36.2 % (ref 36.0–46.0)
Hemoglobin: 12.1 g/dL (ref 12.0–15.0)
MCH: 26.1 pg (ref 26.0–34.0)
MCHC: 33.4 g/dL (ref 30.0–36.0)
MCV: 78.2 fL — ABNORMAL LOW (ref 80.0–100.0)
Platelets: 277 10*3/uL (ref 150–400)
RBC: 4.63 MIL/uL (ref 3.87–5.11)
RDW: 16 % — ABNORMAL HIGH (ref 11.5–15.5)
WBC: 8.7 10*3/uL (ref 4.0–10.5)
nRBC: 0 % (ref 0.0–0.2)

## 2022-05-11 LAB — TYPE AND SCREEN
ABO/RH(D): A POS
Antibody Screen: NEGATIVE

## 2022-05-11 MED ORDER — PHENYLEPHRINE 80 MCG/ML (10ML) SYRINGE FOR IV PUSH (FOR BLOOD PRESSURE SUPPORT): 80.0000 ug | PREFILLED_SYRINGE | INTRAVENOUS | Status: DC | PRN

## 2022-05-11 MED ORDER — OXYCODONE HCL 5 MG PO TABS: 10.0000 mg | ORAL_TABLET | ORAL | Status: DC | PRN

## 2022-05-11 MED ORDER — ONDANSETRON HCL 4 MG PO TABS: 4.0000 mg | ORAL_TABLET | ORAL | Status: DC | PRN

## 2022-05-11 MED ORDER — OXYTOCIN BOLUS FROM INFUSION
333.0000 mL | Freq: Once | INTRAVENOUS | Status: AC
  Administered 2022-05-11: 333 mL via INTRAVENOUS

## 2022-05-11 MED ORDER — OXYTOCIN-SODIUM CHLORIDE 30-0.9 UT/500ML-% IV SOLN
2.5000 [IU]/h | INTRAVENOUS | Status: DC
  Filled 2022-05-11: qty 500

## 2022-05-11 MED ORDER — OXYCODONE-ACETAMINOPHEN 5-325 MG PO TABS: 2.0000 | ORAL_TABLET | ORAL | Status: DC | PRN

## 2022-05-11 MED ORDER — SIMETHICONE 80 MG PO CHEW: 80.0000 mg | CHEWABLE_TABLET | ORAL | Status: DC | PRN

## 2022-05-11 MED ORDER — OXYCODONE-ACETAMINOPHEN 5-325 MG PO TABS: 1.0000 | ORAL_TABLET | ORAL | Status: DC | PRN

## 2022-05-11 MED ORDER — IBUPROFEN 600 MG PO TABS
600.0000 mg | ORAL_TABLET | Freq: Four times a day (QID) | ORAL | Status: DC
  Administered 2022-05-11 – 2022-05-13 (×7): 600 mg via ORAL
  Filled 2022-05-11 (×7): qty 1

## 2022-05-11 MED ORDER — DIBUCAINE (PERIANAL) 1 % EX OINT: 1.0000 | TOPICAL_OINTMENT | CUTANEOUS | Status: DC | PRN

## 2022-05-11 MED ORDER — BENZOCAINE-MENTHOL 20-0.5 % EX AERO
1.0000 | INHALATION_SPRAY | CUTANEOUS | Status: DC | PRN
  Filled 2022-05-11: qty 56

## 2022-05-11 MED ORDER — ONDANSETRON HCL 4 MG/2ML IJ SOLN: 4.0000 mg | INTRAMUSCULAR | Status: DC | PRN

## 2022-05-11 MED ORDER — TETANUS-DIPHTH-ACELL PERTUSSIS 5-2.5-18.5 LF-MCG/0.5 IM SUSY: 0.5000 mL | PREFILLED_SYRINGE | Freq: Once | INTRAMUSCULAR | Status: DC

## 2022-05-11 MED ORDER — ONDANSETRON HCL 4 MG/2ML IJ SOLN: 4.0000 mg | Freq: Four times a day (QID) | INTRAMUSCULAR | Status: DC | PRN

## 2022-05-11 MED ORDER — MEASLES, MUMPS & RUBELLA VAC IJ SOLR: 0.5000 mL | Freq: Once | INTRAMUSCULAR | Status: DC

## 2022-05-11 MED ORDER — EPHEDRINE 5 MG/ML INJ: 10.0000 mg | INTRAVENOUS | Status: DC | PRN

## 2022-05-11 MED ORDER — DIPHENHYDRAMINE HCL 25 MG PO CAPS: 25.0000 mg | ORAL_CAPSULE | Freq: Four times a day (QID) | ORAL | Status: DC | PRN

## 2022-05-11 MED ORDER — ACETAMINOPHEN 325 MG PO TABS
650.0000 mg | ORAL_TABLET | ORAL | Status: DC | PRN
  Administered 2022-05-12 – 2022-05-13 (×3): 650 mg via ORAL
  Filled 2022-05-11 (×3): qty 2

## 2022-05-11 MED ORDER — ZOLPIDEM TARTRATE 5 MG PO TABS: 5.0000 mg | ORAL_TABLET | Freq: Every evening | ORAL | Status: DC | PRN

## 2022-05-11 MED ORDER — FLEET ENEMA 7-19 GM/118ML RE ENEM: 1.0000 | ENEMA | RECTAL | Status: DC | PRN

## 2022-05-11 MED ORDER — FENTANYL CITRATE (PF) 100 MCG/2ML IJ SOLN
INTRAMUSCULAR | Status: AC
  Filled 2022-05-11: qty 2

## 2022-05-11 MED ORDER — METHYLERGONOVINE MALEATE 0.2 MG/ML IJ SOLN: 0.2000 mg | INTRAMUSCULAR | Status: DC | PRN

## 2022-05-11 MED ORDER — PRENATAL MULTIVITAMIN CH
1.0000 | ORAL_TABLET | Freq: Every day | ORAL | Status: DC
  Administered 2022-05-12 – 2022-05-13 (×2): 1 via ORAL
  Filled 2022-05-11 (×2): qty 1

## 2022-05-11 MED ORDER — METHYLERGONOVINE MALEATE 0.2 MG PO TABS: 0.2000 mg | ORAL_TABLET | ORAL | Status: DC | PRN

## 2022-05-11 MED ORDER — LACTATED RINGERS IV SOLN: 500.0000 mL | Freq: Once | INTRAVENOUS | Status: DC

## 2022-05-11 MED ORDER — MAGNESIUM HYDROXIDE 400 MG/5ML PO SUSP: 30.0000 mL | ORAL | Status: DC | PRN

## 2022-05-11 MED ORDER — LIDOCAINE HCL (PF) 1 % IJ SOLN
30.0000 mL | INTRAMUSCULAR | Status: DC | PRN
  Filled 2022-05-11: qty 30

## 2022-05-11 MED ORDER — SENNOSIDES-DOCUSATE SODIUM 8.6-50 MG PO TABS
2.0000 | ORAL_TABLET | Freq: Every day | ORAL | Status: DC
  Administered 2022-05-12 – 2022-05-13 (×2): 2 via ORAL
  Filled 2022-05-11 (×2): qty 2

## 2022-05-11 MED ORDER — SOD CITRATE-CITRIC ACID 500-334 MG/5ML PO SOLN: 30.0000 mL | ORAL | Status: DC | PRN

## 2022-05-11 MED ORDER — COCONUT OIL OIL: 1.0000 | TOPICAL_OIL | Status: DC | PRN

## 2022-05-11 MED ORDER — FENTANYL CITRATE (PF) 100 MCG/2ML IJ SOLN
50.0000 ug | Freq: Once | INTRAMUSCULAR | Status: AC
  Administered 2022-05-11: 50 ug via INTRAVENOUS

## 2022-05-11 MED ORDER — FENTANYL-BUPIVACAINE-NACL 0.5-0.125-0.9 MG/250ML-% EP SOLN
12.0000 mL/h | EPIDURAL | Status: DC | PRN
  Filled 2022-05-11: qty 250

## 2022-05-11 MED ORDER — DIPHENHYDRAMINE HCL 50 MG/ML IJ SOLN: 12.5000 mg | INTRAMUSCULAR | Status: DC | PRN

## 2022-05-11 MED ORDER — ACETAMINOPHEN 325 MG PO TABS: 650.0000 mg | ORAL_TABLET | ORAL | Status: DC | PRN

## 2022-05-11 MED ORDER — WITCH HAZEL-GLYCERIN EX PADS: 1.0000 | MEDICATED_PAD | CUTANEOUS | Status: DC | PRN

## 2022-05-11 MED ORDER — LACTATED RINGERS IV SOLN: INTRAVENOUS | Status: DC

## 2022-05-11 MED ORDER — LACTATED RINGERS IV SOLN: 500.0000 mL | INTRAVENOUS | Status: DC | PRN

## 2022-05-11 MED ORDER — OXYCODONE HCL 5 MG PO TABS
5.0000 mg | ORAL_TABLET | ORAL | Status: DC | PRN
  Administered 2022-05-12: 5 mg via ORAL
  Filled 2022-05-11: qty 1

## 2022-05-11 NOTE — MAU Note (Signed)
Mia Pierce is a 37 y.o. at [redacted]w[redacted]d here in MAU reporting: ctx and vaginal spotting. Ctx started last night and have progressively strengthened. Denies any LOF and endorses +FM.  Cervical exam on 05/07/22 3/50/-2   Onset of complaint: 05/10/22 Pain score: 8 Vitals:   05/11/22 1438  BP: 120/84  Pulse: 98  Resp: 18  Temp: 97.7 F (36.5 C)     FHT:148 Lab orders placed from triage:  n/a

## 2022-05-11 NOTE — H&P (Signed)
Mia Pierce is a 37 y.o. female, G3 P2002, EGA 38+ weeks with EDC 4-17 presenting for ctx.  On eval in MAU, reg ctx, VE 5 cm.  PNC complicated by AMA, declined genetic screening.  OB History     Gravida  3   Para  2   Term  2   Preterm      AB      Living  2      SAB      IAB      Ectopic      Multiple  0   Live Births  2          Past Medical History:  Diagnosis Date   Abnormal maternal serum screening test 02/14/2015   1:6 Down syndrome risk from first trimester screen; NIPS drawn 1/10    High risk pregnancy with low PAPPA 02/14/2015   0.28 MoM    SVD (spontaneous vaginal delivery) 07/13/2015   Past Surgical History:  Procedure Laterality Date   NO PAST SURGERIES     Family History: family history is not on file. Social History:  reports that she has never smoked. She has never used smokeless tobacco. She reports that she does not drink alcohol and does not use drugs.     Maternal Diabetes: No Genetic Screening: Declined Maternal Ultrasounds/Referrals: Normal Fetal Ultrasounds or other Referrals:  None Maternal Substance Abuse:  No Significant Maternal Medications:  None Significant Maternal Lab Results:  Group B Strep negative Number of Prenatal Visits:greater than 3 verified prenatal visits Other Comments:  None  Review of Systems  Respiratory: Negative.    Cardiovascular: Negative.    Maternal Medical History:  Reason for admission: Contractions.   Contractions: Frequency: regular.   Perceived severity is strong.   Fetal activity: Perceived fetal activity is normal.   Prenatal complications: no prenatal complications Prenatal Complications - Diabetes: none.   Dilation: 9 Effacement (%): 90 Station: Plus 1 Exam by:: Marcelle Overlie RN Blood pressure 94/72, pulse 99, temperature 97.7 F (36.5 C), temperature source Oral, resp. rate 18. Maternal Exam:  Uterine Assessment: Contraction strength is firm.  Contraction frequency is regular.  Abdomen:  Patient reports no abdominal tenderness. Estimated fetal weight is 7 lbs.   Fetal presentation: vertex Introitus: Normal vulva. Normal vagina.  Amniotic fluid character: not assessed. Pelvis: adequate for delivery.     Physical Exam Vitals reviewed.  Constitutional:      Appearance: Normal appearance.  Cardiovascular:     Rate and Rhythm: Normal rate and regular rhythm.  Pulmonary:     Effort: Pulmonary effort is normal. No respiratory distress.  Abdominal:     Palpations: Abdomen is soft.  Genitourinary:    General: Normal vulva.  Neurological:     Mental Status: She is alert.     Prenatal labs: ABO, Rh: --/--/A POS (04/06 1506) Antibody: NEG (04/06 1506) Rubella: Immune (10/04 0000) RPR:   NR HBsAg: Negative (10/04 0000)  HIV: Non-reactive (10/04 0000)  GBS:   neg  Assessment/Plan: IUP at 38+ weeks in active labor.  Admitted, tried to get an episural, but she had SROM with light meconium and progressed rapidly, see delivery note   Zenaida Niece 05/11/2022, 4:19 PM

## 2022-05-12 LAB — RPR: RPR Ser Ql: NONREACTIVE

## 2022-05-12 NOTE — Progress Notes (Signed)
PPD #1 No problems Afeb, VSS Fundus firm, NT at U-1 Continue routine postpartum care 

## 2022-05-13 ENCOUNTER — Encounter (HOSPITAL_COMMUNITY): Payer: Self-pay | Admitting: Obstetrics and Gynecology

## 2022-05-13 NOTE — Lactation Note (Addendum)
This note was copied from a baby's chart. Lactation Consultation Note  Patient Name: Mia Pierce [id number] Date: 05/13/2022 Age:38 hours Reason for consult: Initial assessment  Father interpreted. P3, Mother wants to breastfeed and formula feed. Suggest calling for help to latch as needed. Encouraged offering before formula to establish milk supply. Feed on demand with cues.  Goal 8-12+ times per day after first 24 hrs.  Place baby STS if not cueing.  Mom made aware of O/P services, breastfeeding support groups, community resources, and our phone # for post-discharge questions.     Maternal Data Has patient been taught Hand Expression?: Yes Does the patient have breastfeeding experience prior to this delivery?: Yes How long did the patient breastfeed?: 6 mos (breast and formula)  Feeding Mother's Current Feeding Choice: Breast Milk and Formula Nipple Type: Slow - flow  Interventions Interventions: Education;LC Services brochure Consult Status Consult Status: Complete    Hardie Pulley 05/13/2022, 9:27 AM

## 2022-05-13 NOTE — Discharge Summary (Signed)
Postpartum Discharge Summary     Patient Name: Mia Pierce DOB: [birth year] MRN: [id number]  Date of admission: 05/11/2022 Delivery date:05/11/2022  Delivering provider: Jackelyn Knife, TODD  Date of discharge: 05/13/2022  Admitting diagnosis: Normal labor [O80, Z37.9] Intrauterine pregnancy: [redacted]w[redacted]d     Secondary diagnosis:  Principal Problem:   Normal labor  Additional problems: AMA    Discharge diagnosis: Term Pregnancy Delivered                                              Post partum procedures: none Augmentation: N/A Complications: None  Hospital course: Onset of Labor With Vaginal Delivery      37 y.o. yo G3P3003 at [redacted]w[redacted]d was admitted in Active Labor on 05/11/2022. Labor course was complicated bynothing  Membrane Rupture Time/Date: 3:44 PM ,05/11/2022   Delivery Method:Vaginal, Spontaneous  Episiotomy: None  Lacerations:  1st degree  Patient had a postpartum course complicated by nothing.  She is ambulating, tolerating a regular diet, passing flatus, and urinating well. Patient is discharged home in stable condition on 05/13/22.  Newborn Data: Birth date:05/11/2022  Birth time:3:56 PM  Gender:Female  Living status:Living  Apgars:9 ,9  Weight:3250 g   Magnesium Sulfate received: No BMZ received: No Rhophylac:N/A Transfusion:No  Physical exam  Vitals:   05/12/22 0235 05/12/22 0630 05/12/22 2155 05/13/22 0525  BP: 113/69 105/63 115/72 111/61  Pulse: 80 79 81 90  Resp: 16 16 18 16   Temp: 98.5 F (36.9 C) 98.1 F (36.7 C) 98.1 F (36.7 C) 98.5 F (36.9 C)  TempSrc: Oral Oral Oral Oral  SpO2: 98% 97% 100% 98%   General: alert, cooperative, and no distress Lochia: appropriate Uterine Fundus: firm Incision: N/A DVT Evaluation: No evidence of DVT seen on physical exam. Labs: Lab Results  Component Value Date   WBC 8.7 05/11/2022   HGB 12.1 05/11/2022   HCT 36.2 05/11/2022   MCV 78.2 (L) 05/11/2022   PLT 277 05/11/2022      Latest Ref Rng & Units 05/16/2017    1:39  PM  CMP  Glucose 65 - 99 mg/dL 491   BUN 6 - 20 mg/dL 9   Creatinine 7.91 - 5.05 mg/dL 6.97   Sodium 948 - 016 mmol/L 142   Potassium 3.5 - 5.2 mmol/L 3.8   Chloride 96 - 106 mmol/L 104   CO2 20 - 29 mmol/L 26   Calcium 8.7 - 10.2 mg/dL 8.7   Total Protein 6.0 - 8.5 g/dL 7.0   Total Bilirubin 0.0 - 1.2 mg/dL <5.5   Alkaline Phos 39 - 117 IU/L 44   AST 0 - 40 IU/L 12   ALT 0 - 32 IU/L 12    Edinburgh Score:    05/12/2022    4:40 PM  Edinburgh Postnatal Depression Scale Screening Tool  I have been able to laugh and see the funny side of things. 0  I have looked forward with enjoyment to things. 0  I have blamed myself unnecessarily when things went wrong. 0  I have been anxious or worried for no good reason. 0  I have felt scared or panicky for no good reason. 0  Things have been getting on top of me. 0  I have been so unhappy that I have had difficulty sleeping. 0  I have felt sad or miserable. 0  I have been so  unhappy that I have been crying. 0  The thought of harming myself has occurred to me. 0  Edinburgh Postnatal Depression Scale Total 0      After visit meds:  Allergies as of 05/13/2022   No Known Allergies      Medication List     STOP taking these medications    amoxicillin-clavulanate 875-125 MG tablet Commonly known as: AUGMENTIN   benzonatate 100 MG capsule Commonly known as: TESSALON   cetirizine 10 MG tablet Commonly known as: ZYRTEC   fluticasone 50 MCG/ACT nasal spray Commonly known as: FLONASE   hydrocortisone 25 MG suppository Commonly known as: ANUSOL-HC   Mucinex DM Maximum Strength 60-1200 MG Tb12       TAKE these medications    prenatal multivitamin Tabs tablet Take 1 tablet by mouth daily at 12 noon.         Discharge home in stable condition  Infant Disposition:home with mother Discharge instruction: per After Visit Summary and Postpartum booklet. Activity: Advance as tolerated. Pelvic rest for 6 weeks.  Diet:  routine diet Anticipated Birth Control: Unsure Postpartum Appointment:4 weeks Additional Postpartum F/U: Postpartum Depression checkup Future Appointments:No future appointments. Follow up Visit:  Follow-up Information     Waynard Reeds, MD. Call in 4 week(s).   Specialty: Obstetrics and Gynecology Contact information: [address] [place] [address] [phone]                 Interpreter Dorene Grebe [phone] used for entirety of visit.    05/13/2022 Philip Aspen, DO

## 2022-05-16 ENCOUNTER — Telehealth (HOSPITAL_COMMUNITY): Payer: Self-pay

## 2022-05-16 NOTE — Telephone Encounter (Signed)
Chart review.

## 2022-05-20 ENCOUNTER — Telehealth (HOSPITAL_COMMUNITY): Payer: Self-pay | Admitting: *Deleted

## 2022-05-20 NOTE — Telephone Encounter (Signed)
Attempted hospital discharge follow-up phone call with [address] [place] [phone]. Interpreter reported that someone answered and immediately hung up. Placed a second call with no answer. Deforest Hoyles, RN, 05/20/22, 2005

## 2022-05-30 ENCOUNTER — Inpatient Hospital Stay (HOSPITAL_COMMUNITY): Admission: RE | Admit: 2022-05-30 | Payer: BC Managed Care – PPO | Source: Home / Self Care | Admitting: Obstetrics

## 2022-05-30 ENCOUNTER — Inpatient Hospital Stay (HOSPITAL_COMMUNITY): Payer: BC Managed Care – PPO

## 2022-08-06 DIAGNOSIS — N760 Acute vaginitis: Secondary | ICD-10-CM | POA: Diagnosis not present

## 2022-08-06 DIAGNOSIS — R102 Pelvic and perineal pain: Secondary | ICD-10-CM | POA: Diagnosis not present

## 2022-12-24 DIAGNOSIS — R5383 Other fatigue: Secondary | ICD-10-CM | POA: Diagnosis not present

## 2022-12-24 DIAGNOSIS — N926 Irregular menstruation, unspecified: Secondary | ICD-10-CM | POA: Diagnosis not present

## 2022-12-24 DIAGNOSIS — R42 Dizziness and giddiness: Secondary | ICD-10-CM | POA: Diagnosis not present

## 2022-12-24 DIAGNOSIS — L989 Disorder of the skin and subcutaneous tissue, unspecified: Secondary | ICD-10-CM | POA: Diagnosis not present

## 2023-10-04 DIAGNOSIS — R509 Fever, unspecified: Secondary | ICD-10-CM | POA: Diagnosis not present

## 2023-10-04 DIAGNOSIS — Z03818 Encounter for observation for suspected exposure to other biological agents ruled out: Secondary | ICD-10-CM | POA: Diagnosis not present

## 2023-10-04 DIAGNOSIS — J029 Acute pharyngitis, unspecified: Secondary | ICD-10-CM | POA: Diagnosis not present

## 2023-10-04 DIAGNOSIS — R051 Acute cough: Secondary | ICD-10-CM | POA: Diagnosis not present

## 2024-01-14 DIAGNOSIS — H6691 Otitis media, unspecified, right ear: Secondary | ICD-10-CM | POA: Diagnosis not present

## 2024-03-16 ENCOUNTER — Institutional Professional Consult (permissible substitution) (INDEPENDENT_AMBULATORY_CARE_PROVIDER_SITE_OTHER): Admitting: Physician Assistant
